# Patient Record
Sex: Female | Born: 1995
Health system: Southern US, Community
[De-identification: ages and names within clinical notes are randomized; demographics above are authoritative.]

## PROBLEM LIST (undated history)

## (undated) ENCOUNTER — Inpatient Hospital Stay: Payer: Self-pay

## (undated) DIAGNOSIS — Z789 Other specified health status: Secondary | ICD-10-CM

## (undated) DIAGNOSIS — N912 Amenorrhea, unspecified: Secondary | ICD-10-CM

## (undated) HISTORY — PX: ANAL FISSURE REPAIR: SHX2312

## (undated) HISTORY — DX: Amenorrhea, unspecified: N91.2

---
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
## 2015-01-20 ENCOUNTER — Encounter: Payer: Self-pay | Admitting: Family Medicine

## 2015-01-20 ENCOUNTER — Ambulatory Visit (INDEPENDENT_AMBULATORY_CARE_PROVIDER_SITE_OTHER): Payer: Federal, State, Local not specified - PPO | Admitting: Family Medicine

## 2015-01-20 VITALS — BP 118/48 | HR 100 | Temp 98.2°F | Resp 16 | Ht 64.0 in | Wt 114.6 lb

## 2015-01-20 DIAGNOSIS — F419 Anxiety disorder, unspecified: Secondary | ICD-10-CM | POA: Diagnosis not present

## 2015-01-20 DIAGNOSIS — R519 Headache, unspecified: Secondary | ICD-10-CM | POA: Insufficient documentation

## 2015-01-20 DIAGNOSIS — M791 Myalgia, unspecified site: Secondary | ICD-10-CM | POA: Insufficient documentation

## 2015-01-20 DIAGNOSIS — Z789 Other specified health status: Secondary | ICD-10-CM | POA: Insufficient documentation

## 2015-01-20 DIAGNOSIS — A749 Chlamydial infection, unspecified: Secondary | ICD-10-CM | POA: Insufficient documentation

## 2015-01-20 DIAGNOSIS — G4700 Insomnia, unspecified: Secondary | ICD-10-CM | POA: Insufficient documentation

## 2015-01-20 DIAGNOSIS — Z7289 Other problems related to lifestyle: Secondary | ICD-10-CM | POA: Insufficient documentation

## 2015-01-20 DIAGNOSIS — Z7251 High risk heterosexual behavior: Secondary | ICD-10-CM | POA: Insufficient documentation

## 2015-01-20 DIAGNOSIS — F39 Unspecified mood [affective] disorder: Secondary | ICD-10-CM | POA: Insufficient documentation

## 2015-01-20 DIAGNOSIS — R51 Headache: Secondary | ICD-10-CM

## 2015-01-20 MED ORDER — PAROXETINE HCL 10 MG PO TABS: 10.0000 mg | ORAL_TABLET | Freq: Every day | ORAL | Status: DC

## 2015-01-20 MED ORDER — TRAZODONE HCL 50 MG PO TABS: 25.0000 mg | ORAL_TABLET | Freq: Every evening | ORAL | Status: DC | PRN

## 2015-01-20 NOTE — Patient Instructions (Signed)
Let me know earlier than two weeks if you can not tolerate the medication.

## 2015-01-20 NOTE — Progress Notes (Signed)
Subjective:     Patient ID: Haley Russell, female   DOB: 07/06/1995, 19 y.o.   MRN: [id number]  HPI  Chief Complaint  Patient presents with  . Anxiety    Patient comes in office today with concerns of anxiety she states that she has been dealing with this issue for the past two years but has gradually worsened since December. Patient recalls that her boyfriend suffered from anger issues since his brothers death last year and it was weighing heavy in there relationship. Patient states that they broke up two months ago, she states that she has tightness in her chest when she is around large crowds, and has developed OCD tendencies.   Denies specific depression or suicidal thoughts though states she does feel down at times. 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 Reports a need to continually clean and keep things in a certain place. Has trouble sleeping-"My insides want to stay moving". She reports anger with most of her sx beginning at the age of 65 when her father died. Reports a psychiatric hospitalization at that time. Currently living with her mother denies substance use.   Review of Systems  Psychiatric/Behavioral:       States she has been in counseling before but it did not help "they can't tell what is going on in my head".       Objective:   Physical Exam  Constitutional: She appears well-developed and well-nourished.  Psychiatric:  Appears irritable: "I feel angry now".  Scores in moderate depression range on PHQ 9 but denies that she is significantly depressed.     Assessment:    1. Acute anxiety - PARoxetine (PAXIL) 10 MG tablet; Take 1 tablet (10 mg total) by mouth daily. Start at 1/2 pill for the first five days to minimize stomach upset.  Dispense: 30 tablet; Refill: 0  2. Insomnia - traZODone (DESYREL) 50 MG tablet; Take 0.5-1 tablets (25-50 mg total) by mouth at bedtime as  needed for sleep.  Dispense: 30 tablet; Refill: 3    Plan:    F/u in two weeks. Will consider psychiatric referral if not improving on current medication.

## 2015-11-13 ENCOUNTER — Encounter: Payer: Self-pay | Admitting: Physician Assistant

## 2015-11-13 ENCOUNTER — Ambulatory Visit (INDEPENDENT_AMBULATORY_CARE_PROVIDER_SITE_OTHER): Payer: Federal, State, Local not specified - PPO | Admitting: Physician Assistant

## 2015-11-13 VITALS — BP 94/52 | HR 120 | Temp 97.5°F | Resp 16 | Wt 129.0 lb

## 2015-11-13 DIAGNOSIS — B379 Candidiasis, unspecified: Secondary | ICD-10-CM | POA: Diagnosis not present

## 2015-11-13 DIAGNOSIS — A499 Bacterial infection, unspecified: Secondary | ICD-10-CM | POA: Diagnosis not present

## 2015-11-13 DIAGNOSIS — B9689 Other specified bacterial agents as the cause of diseases classified elsewhere: Secondary | ICD-10-CM

## 2015-11-13 DIAGNOSIS — Z7251 High risk heterosexual behavior: Secondary | ICD-10-CM | POA: Diagnosis not present

## 2015-11-13 DIAGNOSIS — N898 Other specified noninflammatory disorders of vagina: Secondary | ICD-10-CM

## 2015-11-13 DIAGNOSIS — N760 Acute vaginitis: Secondary | ICD-10-CM | POA: Diagnosis not present

## 2015-11-13 DIAGNOSIS — Z113 Encounter for screening for infections with a predominantly sexual mode of transmission: Secondary | ICD-10-CM | POA: Diagnosis not present

## 2015-11-13 LAB — POCT WET PREP (WET MOUNT)

## 2015-11-13 MED ORDER — FLUCONAZOLE 150 MG PO TABS: 150.0000 mg | ORAL_TABLET | Freq: Once | ORAL | Status: DC

## 2015-11-13 MED ORDER — METRONIDAZOLE 500 MG PO TABS: 500.0000 mg | ORAL_TABLET | Freq: Two times a day (BID) | ORAL | Status: DC

## 2015-11-13 NOTE — Progress Notes (Signed)
Patient ID: Haley Russell, female   DOB: 1996-05-09, 20 y.o.   MRN: [id number]       Patient: Haley Russell Female    DOB: 1996-05-09   20 y.o.   MRN: [id number] Visit Date: 11/13/2015  Today's Provider: Margaretann LovelessJennifer M Shawndra Clute, PA-C   Chief Complaint  Patient presents with  . Vaginal Discharge   Subjective:    HPI  Patient is here to discuss vaginal discharge. She states this has been an issue for about 2 months now but she does not like doctors and waited to come in. Bad odor is present. Discharge is white. She is sexually active and usually uses condoms, but has not done that 100% with her current boyfriend. She states the discharge issue started when she was using condoms all the time. Sometimes after sex vaginal area feels swollen but not every time. Patient denies having any urinary issues. She denies any history of STDs or STD exposure to her knowledge.    No Known Allergies Previous Medications   PAROXETINE (PAXIL) 10 MG TABLET    Take 1 tablet (10 mg total) by mouth daily. Start at 1/2 pill for the first five days to minimize stomach upset.   TRAZODONE (DESYREL) 50 MG TABLET    Take 0.5-1 tablets (25-50 mg total) by mouth at bedtime as needed for sleep.    Review of Systems  Constitutional: Negative.   Respiratory: Negative.   Cardiovascular: Negative.   Genitourinary: Positive for vaginal discharge. Negative for dysuria, urgency, frequency, flank pain, difficulty urinating and pelvic pain.    Social History  Substance Use Topics  . Smoking status: Never Smoker   . Smokeless tobacco: Not on file  . Alcohol Use: Not on file   Objective:   BP 94/52 mmHg  Pulse 120  Temp(Src) 97.5 F (36.4 C)  Resp 16  Wt 129 lb (58.514 kg)  LMP 10/13/2015  Physical Exam  Constitutional: She appears well-developed and well-nourished. No distress.  Cardiovascular: Normal rate, regular rhythm and normal heart sounds.  Exam reveals no gallop and no friction rub.   No murmur  heard. Pulmonary/Chest: Effort normal and breath sounds normal. No respiratory distress. She has no wheezes. She has no rales.  Abdominal: Soft. Bowel sounds are normal. She exhibits no distension. There is no tenderness.  Genitourinary: No erythema or tenderness in the vagina. No signs of injury around the vagina. Vaginal discharge (white, milky, foul smelling) found.  Skin: She is not diaphoretic.  Vitals reviewed.       Assessment & Plan:     1. Vaginal discharge Wet prep was positive for yeast and clue cells. Will also send a swab to check for gonorrhea, Chlamydia and trichomoniasis.Will treat bacterial vaginosis as below with metronidazole and will treat yeast as below with Diflucan. I will follow-up with her pending all of the results. - POCT Wet Prep ([place]) - NuSwab Vaginitis Plus (VG+)  2. Unprotected sex See above medical treatment plan. - NuSwab Vaginitis Plus (VG+) - HIV antibody (with reflex) - RPR  3. Screen for STD (sexually transmitted disease) See above medical treatment plan. - NuSwab Vaginitis Plus (VG+) - HIV antibody (with reflex) - RPR  4. Bacterial vaginosis See above medical treatment plan. - metroNIDAZOLE (FLAGYL) 500 MG tablet; Take 1 tablet (500 mg total) by mouth 2 (two) times daily.  Dispense: 14 tablet; Refill: 0  5. Yeast infection See above medical treatment plan. - fluconazole (DIFLUCAN) 150 MG tablet; Take 1 tablet (150 mg total)  by mouth once.  Dispense: 1 tablet; Refill: 0       Margaretann Loveless, PA-C  [facility]

## 2015-11-13 NOTE — Patient Instructions (Signed)

## 2015-11-16 ENCOUNTER — Telehealth: Payer: Self-pay

## 2015-11-16 LAB — NUSWAB VAGINITIS PLUS (VG+)
ATOPOBIUM VAGINAE: HIGH {score} — AB
BVAB 2: HIGH Score — AB
CANDIDA ALBICANS, NAA: NEGATIVE
Candida glabrata, NAA: NEGATIVE
Chlamydia trachomatis, NAA: NEGATIVE
MEGASPHAERA 1: HIGH {score} — AB
NEISSERIA GONORRHOEAE, NAA: NEGATIVE
Trich vag by NAA: NEGATIVE

## 2015-11-16 NOTE — Telephone Encounter (Signed)
No voicemail set up  Thanks,  -Franziska Podgurski

## 2015-11-16 NOTE — Telephone Encounter (Signed)
-----   Message from Margaretann LovelessJennifer M Burnette, [place]-C sent at 11/16/2015  1:47 PM EDT ----- NuSwab was negative for GC/chlamydia and trichomonas but positive for BV which you are already being treated for. Continue treatment until completed.

## 2015-11-17 NOTE — Telephone Encounter (Signed)
Spoke with Kriste BasqueBecky patient's mother and left message for patient to return call back. Per mother patient doesn't have any minutes on her phone and will probably see her until the weekend.

## 2015-11-24 NOTE — Telephone Encounter (Signed)
Pt informed and voiced understanding of results. 

## 2015-11-24 NOTE — Telephone Encounter (Signed)
Left message with boyfriend. Patient provided this number to contact her and he will give her the message. Number is [phone]  Thanks,  -Westley Blass

## 2016-03-25 ENCOUNTER — Ambulatory Visit (INDEPENDENT_AMBULATORY_CARE_PROVIDER_SITE_OTHER): Payer: Federal, State, Local not specified - PPO | Admitting: Physician Assistant

## 2016-03-25 ENCOUNTER — Encounter: Payer: Self-pay | Admitting: Physician Assistant

## 2016-03-25 ENCOUNTER — Other Ambulatory Visit: Payer: Self-pay

## 2016-03-25 VITALS — BP 102/64 | HR 68 | Temp 98.0°F | Resp 16 | Wt 131.0 lb

## 2016-03-25 DIAGNOSIS — N390 Urinary tract infection, site not specified: Secondary | ICD-10-CM | POA: Diagnosis not present

## 2016-03-25 DIAGNOSIS — R319 Hematuria, unspecified: Secondary | ICD-10-CM | POA: Diagnosis not present

## 2016-03-25 LAB — POCT URINALYSIS DIPSTICK
Bilirubin, UA: NEGATIVE
Glucose, UA: NEGATIVE
Ketones, UA: NEGATIVE
Nitrite, UA: NEGATIVE
Protein, UA: NEGATIVE
Spec Grav, UA: 1.015
Urobilinogen, UA: 0.2
pH, UA: 7

## 2016-03-25 MED ORDER — CIPROFLOXACIN HCL 500 MG PO TABS: 500.0000 mg | ORAL_TABLET | Freq: Two times a day (BID) | ORAL | 0 refills | Status: AC

## 2016-03-25 MED ORDER — SULFAMETHOXAZOLE-TRIMETHOPRIM 800-160 MG PO TABS: 1.0000 | ORAL_TABLET | Freq: Two times a day (BID) | ORAL | 0 refills | Status: DC

## 2016-03-25 NOTE — Patient Instructions (Signed)

## 2016-03-25 NOTE — Progress Notes (Signed)
[name]  Chief Complaint  Patient presents with  . Urinary Tract Infection    Started about six days ago.     Subjective:    Patient ID: Haley Russell, female    DOB: December 27, 1995, 20 y.o.   MRN: [id number]   Urinary Tract Infection: Patient complains of dysuria and frequency She has had symptoms for 6 days. Patient also complains of back pain and stomach ache. Patient denies vaginal discharge. Patient does have a history of recurrent UTI.  Patient does not have a history of pyelonephritis or other renal issues. Patient reports vaginal discharge pt says she is about to start her period and says it is her usual discharge, and denies new sexual partners. The patient denies recent travel outside of the [place] LMP 03/05/2016.   Review of Systems  Constitutional: Positive for chills, diaphoresis and malaise/fatigue. Negative for fever and weight loss.  Gastrointestinal: Positive for abdominal pain (Slight left sided. ). Negative for blood in stool, constipation, diarrhea, heartburn, nausea and vomiting.  Genitourinary: Positive for dysuria, frequency and urgency (off and on). Negative for hematuria.  Musculoskeletal: Positive for back pain (Left sided back pain).  Neurological: Negative for weakness.       Objective:   BP 102/64 (BP Location: Left Arm, Patient Position: Sitting, Cuff Size: Normal)   Pulse 68   Temp 98 F (36.7 C) (Oral)   Resp 16   Wt 131 lb (59.4 kg)   LMP 03/02/2016   BMI 22.49 kg/m   Patient Active Problem List   Diagnosis Date Noted  . Cephalalgia 01/20/2015  . Insomnia 01/20/2015  . Episodic mood disorder (HCC) 01/20/2015  . Muscle ache 01/20/2015  . Problems related to high-risk sexual behavior 01/20/2015  . Alcohol drinker 01/20/2015    Outpatient Encounter Prescriptions as of 03/25/2016  Medication Sig  . ciprofloxacin (CIPRO) 500 MG tablet Take 1 tablet (500 mg total) by mouth 2 (two) times  daily.  . fluconazole (DIFLUCAN) 150 MG tablet Take 1 tablet (150 mg total) by mouth once.  . metroNIDAZOLE (FLAGYL) 500 MG tablet Take 1 tablet (500 mg total) by mouth 2 (two) times daily.  Marland Kitchen PARoxetine (PAXIL) 10 MG tablet Take 1 tablet (10 mg total) by mouth daily. Start at 1/2 pill for the first five days to minimize stomach upset. (Patient not taking: Reported on 11/13/2015)  . traZODone (DESYREL) 50 MG tablet Take 0.5-1 tablets (25-50 mg total) by mouth at bedtime as needed for sleep. (Patient not taking: Reported on 11/13/2015)  . [DISCONTINUED] sulfamethoxazole-trimethoprim (BACTRIM DS,SEPTRA DS) 800-160 MG tablet Take 1 tablet by mouth 2 (two) times daily.   No facility-administered encounter medications on file as of 03/25/2016.     No Known Allergies     Physical Exam  Constitutional: She is oriented to person, place, and time. She appears well-nourished.  Non-toxic appearance. She does not have a sickly appearance. She does not appear ill. No distress.  Abdominal: Soft. Bowel sounds are normal. She exhibits no distension. There is tenderness in the suprapubic area. There is CVA tenderness. There is no rigidity and no guarding.  Neurological: She is alert and oriented to person, place, and time.  Skin: Skin is warm and dry. She is not diaphoretic.  Psychiatric: She has a normal mood and affect. Her behavior is normal.       Assessment & Plan:   Problem List Items Addressed This Visit    None    Visit Diagnoses  Urinary tract infection with hematuria, site unspecified    -  Primary   Relevant Medications   ciprofloxacin (CIPRO) 500 MG tablet      1. Ordered in office urinalysis with culture.   Urinalysis    Component Value Date/Time   COLORURINE Yellow 09/13/2012 1828   APPEARANCEUR Cloudy 09/13/2012 1828   LABSPEC 1.025 09/13/2012 1828   PHURINE 7.0 09/13/2012 1828   GLUCOSEU Negative 09/13/2012 1828   HGBUR Negative 09/13/2012 1828   BILIRUBINUR Negative  09/13/2012 1828   KETONESUR Negative 09/13/2012 1828   PROTEINUR 30 mg/dL 16/10/960404/08/2012 [id number]   NITRITE Negative 09/13/2012 1828   LEUKOCYTESUR 3+ 09/13/2012 1828    2. Patient not toxic looking, but does have left sided CVA tenderness. Will treat like pyelonephritis. Instructed the patient to complete the following course of antibiotics:  Cipro 500 mg BID x 7 days.   3. Patient should report back to clinic or to emergency room should they experience extreme low back pain, fever, nausea, vomiting.     Patient Instructions  Urinary Tract Infection Urinary tract infections (UTIs) can develop anywhere along your urinary tract. Your urinary tract is your body's drainage system for removing wastes and extra water. Your urinary tract includes two kidneys, two ureters, a bladder, and a urethra. Your kidneys are a pair of bean-shaped organs. Each kidney is about the size of your fist. They are located below your ribs, one on each side of your spine. CAUSES Infections are caused by microbes, which are microscopic organisms, including fungi, viruses, and bacteria. These organisms are so small that they can only be seen through a microscope. Bacteria are the microbes that most commonly cause UTIs. SYMPTOMS  Symptoms of UTIs may vary by age and gender of the patient and by the location of the infection. Symptoms in young women typically include a frequent and intense urge to urinate and a painful, burning feeling in the bladder or urethra during urination. Older women and men are more likely to be tired, shaky, and weak and have muscle aches and abdominal pain. A fever may mean the infection is in your kidneys. Other symptoms of a kidney infection include pain in your back or sides below the ribs, nausea, and vomiting. DIAGNOSIS To diagnose a UTI, your caregiver will ask you about your symptoms. Your caregiver will also ask you to provide a urine sample. The urine sample will be tested for bacteria and white  blood cells. White blood cells are made by your body to help fight infection. TREATMENT  Typically, UTIs can be treated with medication. Because most UTIs are caused by a bacterial infection, they usually can be treated with the use of antibiotics. The choice of antibiotic and length of treatment depend on your symptoms and the type of bacteria causing your infection. HOME CARE INSTRUCTIONS  If you were prescribed antibiotics, take them exactly as your caregiver instructs you. Finish the medication even if you feel better after you have only taken some of the medication.  Drink enough water and fluids to keep your urine clear or pale yellow.  Avoid caffeine, tea, and carbonated beverages. They tend to irritate your bladder.  Empty your bladder often. Avoid holding urine for long periods of time.  Empty your bladder before and after sexual intercourse.  After a bowel movement, women should cleanse from front to back. Use each tissue only once. SEEK MEDICAL CARE IF:   You have back pain.  You develop a fever.  Your symptoms do  not begin to resolve within 3 days. SEEK IMMEDIATE MEDICAL CARE IF:   You have severe back pain or lower abdominal pain.  You develop chills.  You have nausea or vomiting.  You have continued burning or discomfort with urination. MAKE SURE YOU:   Understand these instructions.  Will watch your condition.  Will get help right away if you are not doing well or get worse.   This information is not intended to replace advice given to you by your health care provider. Make sure you discuss any questions you have with your health care provider.   Document Released: 03/09/2005 Document Revised: 02/18/2015 Document Reviewed: 07/08/2011 Elsevier Interactive Patient Education [employer]

## 2016-03-25 NOTE — Addendum Note (Signed)
Addended by: Kavin LeechWALSH, Shimshon Narula E on: 03/25/2016 11:34 AM   Modules accepted: Orders

## 2016-03-28 ENCOUNTER — Telehealth: Payer: Self-pay

## 2016-03-28 LAB — URINE CULTURE

## 2016-03-28 NOTE — Telephone Encounter (Signed)
-----   Message from April Paul HalfM Miller, [place] sent at 03/28/2016  4:09 PM EDT -----   ----- Message ----- From: Trey SailorsAdriana M Pollak, PA-C Sent: 03/28/2016   8:05 AM To: [facility]  Uti is sensitive to Cipro, please finish course of antibiotics. Please advise patient thank you

## 2016-03-28 NOTE — Telephone Encounter (Signed)
LMTCB 03/28/2016  Thanks,   -Vernona RiegerLaura

## 2016-03-29 NOTE — Telephone Encounter (Signed)
Unable to reach pt. Will try again later.  

## 2016-03-29 NOTE — Telephone Encounter (Signed)
Pt advised.   Thanks,   -Laura  

## 2016-04-29 ENCOUNTER — Encounter: Payer: Self-pay | Admitting: Physician Assistant

## 2016-04-29 ENCOUNTER — Ambulatory Visit (INDEPENDENT_AMBULATORY_CARE_PROVIDER_SITE_OTHER): Payer: Federal, State, Local not specified - PPO | Admitting: Physician Assistant

## 2016-04-29 VITALS — BP 102/62 | HR 68 | Temp 98.4°F | Resp 16 | Wt 130.0 lb

## 2016-04-29 DIAGNOSIS — N898 Other specified noninflammatory disorders of vagina: Secondary | ICD-10-CM

## 2016-04-29 MED ORDER — METRONIDAZOLE 500 MG PO TABS: 500.0000 mg | ORAL_TABLET | Freq: Two times a day (BID) | ORAL | 0 refills | Status: AC

## 2016-04-29 MED ORDER — METRONIDAZOLE ER 750 MG PO TB24: 750.0000 mg | ORAL_TABLET | Freq: Every day | ORAL | 0 refills | Status: DC

## 2016-04-29 NOTE — Addendum Note (Signed)
Addended by: Trey SailorsPOLLAK, Leonila M on: 04/29/2016 01:22 PM   Modules accepted: Orders

## 2016-04-29 NOTE — Patient Instructions (Signed)

## 2016-04-29 NOTE — Progress Notes (Signed)
Patient: Haley Russell Female    DOB: [birth year]   20 y.o.   MRN: [id number] Visit Date: 04/29/2016  Today's Provider: Trey SailorsAdriana M Pollak, PA-C   Chief Complaint  Patient presents with  . Vaginitis    Started back a few days ago.    Subjective:    Vaginal Discharge  The patient's primary symptoms include a genital odor and vaginal discharge. The patient's pertinent negatives include no genital itching or pelvic pain. This is a recurrent problem. The current episode started in the past 7 days. The problem occurs constantly. The problem has been gradually worsening. The patient is experiencing no pain. Pertinent negatives include no abdominal pain, constipation, diarrhea, flank pain, frequency, hematuria, joint swelling, nausea, painful intercourse or urgency. The vaginal discharge was white. There has been no bleeding.   Patient has a history of bacterial vaginosis   No Known Allergies   Current Outpatient Prescriptions:  .  PARoxetine (PAXIL) 10 MG tablet, Take 1 tablet (10 mg total) by mouth daily. Start at 1/2 pill for the first five days to minimize stomach upset. (Patient not taking: Reported on 04/29/2016), Disp: 30 tablet, Rfl: 0 .  traZODone (DESYREL) 50 MG tablet, Take 0.5-1 tablets (25-50 mg total) by mouth at bedtime as needed for sleep. (Patient not taking: Reported on 04/29/2016), Disp: 30 tablet, Rfl: 3  Review of Systems  Constitutional: Negative.   Gastrointestinal: Negative for abdominal pain, constipation, diarrhea and nausea.  Genitourinary: Positive for vaginal discharge. Negative for decreased urine volume, difficulty urinating, flank pain, frequency, genital sores, hematuria, menstrual problem, pelvic pain, urgency, vaginal bleeding and vaginal pain.    Social History  Substance Use Topics  . Smoking status: Current Every Day Smoker    Types: Cigarettes  . Smokeless tobacco: Never Used  . Alcohol use Not on file   Objective:   BP 102/62 (BP  Location: Left Arm, Patient Position: Sitting, Cuff Size: Normal)   Pulse 68   Temp 98.4 F (36.9 C) (Oral)   Resp 16   Wt 130 lb (59 kg)   LMP 04/01/2016   BMI 22.31 kg/m   Physical Exam  Constitutional: She is oriented to person, place, and time. She appears well-developed and well-nourished.  Abdominal: Soft. She exhibits no distension.  Genitourinary: There is no rash, tenderness or lesion on the right labia. There is no rash, tenderness or lesion on the left labia. No erythema, tenderness or bleeding in the vagina. Vaginal discharge found.  Genitourinary Comments: Homogenous white discharge coating vaginal canal.   Neurological: She is alert and oriented to person, place, and time.        Assessment & Plan:      Problem List Items Addressed This Visit    None    Visit Diagnoses    Vaginal discharge    -  Primary   Relevant Medications   metronidazole (FLAGYL ER) 750 MG 24 hr tablet   Other Relevant Orders   NuSwab BV and Candida, NAA     Patient is 20 y/o presenting with vaginal discharge concerning for Bv. Patient not concerned for STDs. Patient reports this is similar to BV she had before. Will start metronidazole and contact pt about results of NuSwab. Counseled about not using alcohol with this drug.   Return if symptoms worsen or fail to improve.   Patient Instructions  Bacterial Vaginosis Bacterial vaginosis is an infection of the vagina. It happens when too many germs (bacteria) grow in the  vagina. This infection puts you at risk for infections from sex (STIs). Treating this infection can lower your risk for some STIs. You should also treat this if you are pregnant. It can cause your baby to be born early. Follow these instructions at home: Medicines  Take over-the-counter and prescription medicines only as told by your doctor.  Take or use your antibiotic medicine as told by your doctor. Do not stop taking or using it even if you start to feel  better. General instructions  If you your sexual partner is a woman, tell her that you have this infection. She needs to get treatment if she has symptoms. If you have a female partner, he does not need to be treated.  During treatment:  Avoid sex.  Do not douche.  Avoid alcohol as told.  Avoid breastfeeding as told.  Drink enough fluid to keep your pee (urine) clear or pale yellow.  Keep your vagina and butt (rectum) clean.  Wash the area with warm water every day.  Wipe from front to back after you use the toilet.  Keep all follow-up visits as told by your doctor. This is important. Preventing this condition  Do not douche.  Use only warm water to wash around your vagina.  Use protection when you have sex. This includes:  Latex condoms.  Dental dams.  Limit how many people you have sex with. It is best to only have sex with the same person (be monogamous).  Get tested for STIs. Have your partner get tested.  Wear underwear that is cotton or lined with cotton.  Avoid tight pants and pantyhose. This is most important in summer.  Do not use any products that have nicotine or tobacco in them. These include cigarettes and e-cigarettes. If you need help quitting, ask your doctor.  Do not use illegal drugs.  Limit how much alcohol you drink. Contact a doctor if:  Your symptoms do not get better, even after you are treated.  You have more discharge or pain when you pee (urinate).  You have a fever.  You have pain in your belly (abdomen).  You have pain with sex.  Your bleed from your vagina between periods. Summary  This infection happens when too many germs (bacteria) grow in the vagina.  Treating this condition can lower your risk for some infections from sex (STIs).  You should also treat this if you are pregnant. It can cause early (premature) birth.  Do not stop taking or using your antibiotic medicine even if you start to feel better. This  information is not intended to replace advice given to you by your health care provider. Make sure you discuss any questions you have with your health care provider. Document Released: 03/08/2008 Document Revised: 02/13/2016 Document Reviewed: 02/13/2016 Elsevier Interactive Patient Education  2017 [employer]   The entirety of the information documented in the History of Present Illness, Review of Systems and Physical Exam were personally obtained by me. Portions of this information were initially documented by Kavin LeechLaura Walsh, CMA and reviewed by me for thoroughness and accuracy.         Trey SailorsAdriana M Pollak, PA-C  [facility]

## 2016-05-03 ENCOUNTER — Telehealth: Payer: Self-pay

## 2016-05-03 LAB — NUSWAB BV AND CANDIDA, NAA
Atopobium vaginae: HIGH Score — AB
BVAB 2: HIGH Score — AB
Candida albicans, NAA: NEGATIVE
Candida glabrata, NAA: NEGATIVE

## 2016-05-03 NOTE — Telephone Encounter (Signed)
LMTCB-KW 

## 2016-05-03 NOTE — Telephone Encounter (Signed)
-----   Message from Trey SailorsAdriana M Pollak, [place]-C sent at 05/03/2016 10:02 AM EST ----- Nu Swab resulted positive for Bv. Patient should finish her flagyl.

## 2016-05-04 NOTE — Telephone Encounter (Signed)
Patient has been advised. KW 

## 2016-05-04 NOTE — Telephone Encounter (Signed)
Tried to contact patient, patients voicemail box has not been set up yet. Will try contacting patient again at a later time. KW

## 2016-05-30 ENCOUNTER — Ambulatory Visit (INDEPENDENT_AMBULATORY_CARE_PROVIDER_SITE_OTHER): Payer: Federal, State, Local not specified - PPO | Admitting: Physician Assistant

## 2016-05-30 ENCOUNTER — Encounter: Payer: Self-pay | Admitting: Physician Assistant

## 2016-05-30 VITALS — BP 102/64 | HR 96 | Temp 98.3°F | Resp 16 | Wt 136.0 lb

## 2016-05-30 DIAGNOSIS — N760 Acute vaginitis: Secondary | ICD-10-CM

## 2016-05-30 DIAGNOSIS — J039 Acute tonsillitis, unspecified: Secondary | ICD-10-CM | POA: Diagnosis not present

## 2016-05-30 DIAGNOSIS — B9689 Other specified bacterial agents as the cause of diseases classified elsewhere: Secondary | ICD-10-CM

## 2016-05-30 DIAGNOSIS — J069 Acute upper respiratory infection, unspecified: Secondary | ICD-10-CM | POA: Diagnosis not present

## 2016-05-30 LAB — POCT INFLUENZA A/B
Influenza A, POC: NEGATIVE
Influenza B, POC: NEGATIVE

## 2016-05-30 LAB — POCT RAPID STREP A (OFFICE): Rapid Strep A Screen: NEGATIVE

## 2016-05-30 MED ORDER — CLINDAMYCIN HCL 300 MG PO CAPS: 300.0000 mg | ORAL_CAPSULE | Freq: Two times a day (BID) | ORAL | 0 refills | Status: AC

## 2016-05-30 MED ORDER — PREDNISONE 20 MG PO TABS: 20.0000 mg | ORAL_TABLET | Freq: Every day | ORAL | 0 refills | Status: AC

## 2016-05-30 NOTE — Progress Notes (Addendum)
Patient: Haley Russell Female    DOB: 1995-11-30   20 y.o.   MRN: [id number] Visit Date: 05/30/2016  Today's Provider: Trey SailorsAdriana M Pollak, PA-C   Chief Complaint  Patient presents with  . Sore Throat    Started Yesterday  . URI   Subjective:    Sore Throat   This is a new problem. The current episode started yesterday. The problem has been unchanged. Neither side of throat is experiencing more pain than the other. The pain is at a severity of 5/10. Associated symptoms include congestion, coughing, headaches, a plugged ear sensation, shortness of breath and trouble swallowing. Pertinent negatives include no ear discharge, ear pain, neck pain or stridor. She has tried nothing for the symptoms. The treatment provided no relief.  URI   This is a new problem. The current episode started 1 to 4 weeks ago. The problem has been gradually worsening. Associated symptoms include congestion, coughing, headaches, a plugged ear sensation, rhinorrhea, sneezing, a sore throat and wheezing. Pertinent negatives include no dysuria, ear pain, nausea, neck pain or sinus pain. She has tried nothing for the symptoms. The treatment provided no relief.  Vaginal Discharge  The patient's primary symptoms include a genital odor and vaginal discharge. The patient's pertinent negatives include no pelvic pain or vaginal bleeding. This is a recurrent problem. The current episode started in the past 7 days. The problem has been unchanged. She is not pregnant. Associated symptoms include chills, headaches and a sore throat. Pertinent negatives include no back pain, dysuria, fever, flank pain, frequency or nausea. The vaginal discharge was white and malodorous. No, her partner does not have an STD.   No Known Allergies   Current Outpatient Prescriptions:  .  PARoxetine (PAXIL) 10 MG tablet, Take 1 tablet (10 mg total) by mouth daily. Start at 1/2 pill for the first five days to minimize stomach upset. (Patient  not taking: Reported on 04/29/2016), Disp: 30 tablet, Rfl: 0 .  traZODone (DESYREL) 50 MG tablet, Take 0.5-1 tablets (25-50 mg total) by mouth at bedtime as needed for sleep. (Patient not taking: Reported on 04/29/2016), Disp: 30 tablet, Rfl: 3  Review of Systems  Constitutional: Positive for chills, diaphoresis and fatigue. Negative for activity change, appetite change, fever and unexpected weight change.  HENT: Positive for congestion, hearing loss, postnasal drip, rhinorrhea, sinus pressure, sneezing, sore throat and trouble swallowing. Negative for ear discharge, ear pain, sinus pain, tinnitus and voice change.   Eyes: Negative.   Respiratory: Positive for cough, shortness of breath and wheezing. Negative for apnea, choking, chest tightness and stridor.   Gastrointestinal: Negative.  Negative for nausea.  Genitourinary: Positive for vaginal discharge. Negative for dysuria, flank pain, frequency and pelvic pain.  Musculoskeletal: Positive for myalgias. Negative for arthralgias, back pain, gait problem, joint swelling, neck pain and neck stiffness.  Neurological: Positive for headaches. Negative for dizziness and light-headedness.    Social History  Substance Use Topics  . Smoking status: Current Every Day Smoker    Types: Cigarettes  . Smokeless tobacco: Never Used  . Alcohol use Not on file   Objective:   There were no vitals taken for this visit.  Physical Exam  Constitutional: She appears well-developed and well-nourished.  HENT:  Mouth/Throat: Uvula is midline. Posterior oropharyngeal edema and posterior oropharyngeal erythema present. No oropharyngeal exudate or tonsillar abscesses.  Tms dull bilaterally. Tonsils are grossly swollen bilaterally without petechiae or exudate.  Eyes: Conjunctivae are normal.  Cardiovascular:  Normal rate and regular rhythm.   Pulmonary/Chest: Effort normal. No respiratory distress. She has wheezes in the right upper field. She has no rales.    Abdominal: Soft. Bowel sounds are normal. She exhibits no distension. There is no tenderness. There is no CVA tenderness.  Lymphadenopathy:    She has cervical adenopathy.        Assessment & Plan:      Problem List Items Addressed This Visit    None    Visit Diagnoses    Tonsillitis    -  Primary   Relevant Medications   predniSONE (DELTASONE) 20 MG tablet   Other Relevant Orders   Epstein-Barr virus VCA antibody panel   Upper respiratory tract infection, unspecified type       Relevant Medications   clindamycin (CLEOCIN) 300 MG capsule   Other Relevant Orders   Epstein-Barr virus VCA antibody panel   Bacterial vaginosis       Relevant Medications   clindamycin (CLEOCIN) 300 MG capsule     Patient is 20 y/o presenting with above issues.  URI  Rapid flu and strep swab negative in office today. Patient declines Xray today. Counseled patient on various etiologies, most likely viral. Offered patient an EBV panel as she fits the symptom profile and demographic. Patient may not be able to get this test 2/2 financial reasons, but she will try if it might help her with missing work. Counseled patient on infection control precautions and symptomatic treatment for mono. Will give steroids for tonsillitis, counseled on return precautions.  Bacterial Vaginosis  Patient having recurrence of symptoms. Was treated last time with flagyl Po. Patient did consume alcohol while on this, but had no adverse effects. Pt declines any sort of vaginal suppository. Will switch to clindamycin Po. Have counseled patient on adverse effects, such as C. Diff, patient accepts risks.  Return if symptoms worsen or fail to improve.  There are no Patient Instructions on file for this visit.  The entirety of the information documented in the History of Present Illness, Review of Systems and Physical Exam were personally obtained by me. Portions of this information were initially documented by Kavin LeechLaura Lynett Brasil, CMA  and reviewed by me for thoroughness and accuracy.           Trey SailorsAdriana M Pollak, PA-C  [facility]

## 2016-05-30 NOTE — Patient Instructions (Signed)
Tonsillitis Tonsillitis is an infection of the throat. This infection causes the tonsils to become red, tender, and puffy (swollen). Tonsils are groups of tissue at the back of your throat. If bacteria caused your infection, antibiotic medicine will be given to you. Sometimes symptoms of tonsillitis can be relieved with the use of steroid medicine. If your tonsillitis is severe and happens often, you may need to get your tonsils removed (tonsillectomy). Follow these instructions at home:  Rest and sleep often.  Drink enough fluids to keep your pee (urine) clear or pale yellow.  While your throat is sore, eat soft or liquid foods like:  Soup.  Ice cream.  Instant breakfast drinks.  Eat frozen ice pops.  Gargle with a warm or cold liquid to help soothe the throat. Gargle with a water and salt mix. Mix 1/4 teaspoon of salt and 1/4 teaspoon of baking soda in 1 cup of water.  Only take medicines as told by your doctor.  If you are given medicines (antibiotics), take them as told. Finish them even if you start to feel better. Contact a doctor if:  You have large, tender lumps in your neck.  You have a rash.  You cough up green, yellow-brown, or bloody fluid.  You cannot swallow liquids or food for 24 hours.  You notice that only one of your tonsils is swollen. Get help right away if:  You throw up (vomit).  You have a very bad headache.  You have a stiff neck.  You have chest pain.  You have trouble breathing or swallowing.  You have bad throat pain, drooling, or your voice changes.  You have bad pain not helped by medicine.  You cannot fully open your mouth.  You have redness, puffiness, or bad pain in the neck.  You have a fever. This information is not intended to replace advice given to you by your health care provider. Make sure you discuss any questions you have with your health care provider. Document Released: 11/16/2007 Document Revised: 11/05/2015 Document  Reviewed: 11/16/2012 Elsevier Interactive Patient Education  2017 [employer]

## 2016-07-27 ENCOUNTER — Telehealth: Payer: Self-pay | Admitting: Family Medicine

## 2016-07-27 NOTE — Telephone Encounter (Signed)
Pt called wanting Charnell to call her back about her vaginal yeast.infection.  She would like something called in.  She uses [facility] [address]  Her call back is [phone]  Thanks Barth Kirkseri

## 2016-07-27 NOTE — Telephone Encounter (Signed)
Patient reports that she has had symptoms for about 2 days. She reports that she has a white discharge that looks like cottage cheese. She has very mild itching, and she has not been using anything OTC for her symptoms. Patient reports that the OTC creams seem to make her symptoms worse.  She also mentions that she has not been sexually active within the last 2 weeks. She denies any vaginal odors or vaginal bleeding.

## 2016-07-28 NOTE — Telephone Encounter (Signed)
I have treated patient before for BV but for not yeast. Would prefer patient to come in so I can assess. Thanks.

## 2016-07-28 NOTE — Telephone Encounter (Signed)
Pt called back.  Please call [phone]  Thanks Barth Kirksteri

## 2016-07-28 NOTE — Telephone Encounter (Signed)
Patient was advised and declined coming in for an appt. KW

## 2017-03-31 ENCOUNTER — Encounter: Payer: Self-pay | Admitting: Physician Assistant

## 2017-03-31 ENCOUNTER — Ambulatory Visit (INDEPENDENT_AMBULATORY_CARE_PROVIDER_SITE_OTHER): Payer: Federal, State, Local not specified - PPO | Admitting: Physician Assistant

## 2017-03-31 VITALS — BP 108/72 | HR 75 | Temp 97.9°F | Resp 14 | Wt 148.2 lb

## 2017-03-31 DIAGNOSIS — J069 Acute upper respiratory infection, unspecified: Secondary | ICD-10-CM | POA: Diagnosis not present

## 2017-03-31 DIAGNOSIS — B9789 Other viral agents as the cause of diseases classified elsewhere: Secondary | ICD-10-CM | POA: Diagnosis not present

## 2017-03-31 NOTE — Progress Notes (Signed)
       Patient: Haley Russell Female    DOB: 1996-02-23   21 y.o.   MRN: [id number] Visit Date: 03/31/2017  Today's Provider: Trey SailorsAdriana M Leslea Vowles, PA-C   Chief Complaint  Patient presents with  . Nasal Congestion   Subjective:    HPI  Patient is here due not feeling well. Symptoms started yesterday 03/30/17. Symptoms are head congestion, sinus pressure and pain, headache, nasal congestion, runny nose, cough-productive with brownish mucus, sneezing, fatigue, ears popping, felt like she had a slight fever yesterday morning. She has taking cold medicine over the counter. She did get her flu shot for the season.    No Known Allergies   Current Outpatient Prescriptions:  .  AFLURIA QUADRIVALENT 0.5 ML injection, inject 0.5 milliliter intramuscularly, Disp: , Rfl: 0  Review of Systems  Constitutional: Positive for fatigue.  HENT: Positive for congestion, postnasal drip, rhinorrhea, sinus pain, sinus pressure, sneezing and voice change.   Respiratory: Positive for cough. Negative for apnea, chest tightness and wheezing.   Cardiovascular: Negative.   Gastrointestinal: Positive for vomiting (this morning from phlegm/drainage per patient).  Neurological: Positive for headaches. Negative for dizziness and light-headedness.    Social History  Substance Use Topics  . Smoking status: Current Every Day Smoker    Packs/day: 1.50    Types: Cigarettes  . Smokeless tobacco: Never Used  . Alcohol use Not on file   Objective:   BP 108/72   Pulse 75   Temp 97.9 F (36.6 C)   Resp 14   Wt 148 lb 3.2 oz (67.2 kg)   SpO2 98%   BMI 25.44 kg/m  Vitals:   03/31/17 1438  BP: 108/72  Pulse: 75  Resp: 14  Temp: 97.9 F (36.6 C)  SpO2: 98%  Weight: 148 lb 3.2 oz (67.2 kg)     Physical Exam  Constitutional: She appears well-developed and well-nourished.  HENT:  Right Ear: External ear normal.  Left Ear: External ear normal.  Mouth/Throat: Oropharynx is clear and moist. No  oropharyngeal exudate.  Eyes: Right eye exhibits discharge. Left eye exhibits discharge.  Neck: Neck supple.  Cardiovascular: Normal rate and regular rhythm.   Pulmonary/Chest: Effort normal. No respiratory distress. She has wheezes. She has no rales.  Slight expiratory wheeze RUF.  Lymphadenopathy:    She has no cervical adenopathy.  Skin: Skin is warm and dry.  Psychiatric: She has a normal mood and affect. Her behavior is normal.        Assessment & Plan:     1. Viral URI with cough  7-10 course. May call in 10 days if develop resulting sinus infection. OTC medications like Sudafed and flonase for congestion. Work note provided.  Return if symptoms worsen or fail to improve.  The entirety of the information documented in the History of Present Illness, Review of Systems and Physical Exam were personally obtained by me. Portions of this information were initially documented by [facility] Hopkins, CMA and reviewed by me for thoroughness and accuracy.             Trey SailorsAdriana M Cora Brierley, PA-C  [facility]

## 2017-03-31 NOTE — Patient Instructions (Signed)
Upper Respiratory Infection, Adult Most upper respiratory infections (URIs) are caused by a virus. A URI affects the nose, throat, and upper air passages. The most common type of URI is often called "the common cold." Follow these instructions at home:  Take medicines only as told by your doctor.  Gargle warm saltwater or take cough drops to comfort your throat as told by your doctor.  Use a warm mist humidifier or inhale steam from a shower to increase air moisture. This may make it easier to breathe.  Drink enough fluid to keep your pee (urine) clear or pale yellow.  Eat soups and other clear broths.  Have a healthy diet.  Rest as needed.  Go back to work when your fever is gone or your doctor says it is okay. ? You may need to stay home longer to avoid giving your URI to others. ? You can also wear a face mask and wash your hands often to prevent spread of the virus.  Use your inhaler more if you have asthma.  Do not use any tobacco products, including cigarettes, chewing tobacco, or electronic cigarettes. If you need help quitting, ask your doctor. Contact a doctor if:  You are getting worse, not better.  Your symptoms are not helped by medicine.  You have chills.  You are getting more short of breath.  You have brown or red mucus.  You have yellow or brown discharge from your nose.  You have pain in your face, especially when you bend forward.  You have a fever.  You have puffy (swollen) neck glands.  You have pain while swallowing.  You have white areas in the back of your throat. Get help right away if:  You have very bad or constant: ? Headache. ? Ear pain. ? Pain in your forehead, behind your eyes, and over your cheekbones (sinus pain). ? Chest pain.  You have long-lasting (chronic) lung disease and any of the following: ? Wheezing. ? Long-lasting cough. ? Coughing up blood. ? A change in your usual mucus.  You have a stiff neck.  You have  changes in your: ? Vision. ? Hearing. ? Thinking. ? Mood. This information is not intended to replace advice given to you by your health care provider. Make sure you discuss any questions you have with your health care provider. Document Released: 11/16/2007 Document Revised: 01/31/2016 Document Reviewed: 09/04/2013 Elsevier Interactive Patient Education  2018 Elsevier Inc.  

## 2017-04-24 ENCOUNTER — Encounter: Payer: Self-pay | Admitting: Physician Assistant

## 2017-04-24 ENCOUNTER — Ambulatory Visit (INDEPENDENT_AMBULATORY_CARE_PROVIDER_SITE_OTHER): Payer: Federal, State, Local not specified - PPO | Admitting: Physician Assistant

## 2017-04-24 VITALS — BP 104/68 | HR 72 | Temp 97.9°F | Resp 16 | Wt 147.0 lb

## 2017-04-24 DIAGNOSIS — Z3201 Encounter for pregnancy test, result positive: Secondary | ICD-10-CM

## 2017-04-24 DIAGNOSIS — N912 Amenorrhea, unspecified: Secondary | ICD-10-CM

## 2017-04-24 NOTE — Patient Instructions (Addendum)
First Trimester of Pregnancy The first trimester of pregnancy is from week 1 until the end of week 13 (months 1 through 3). During this time, your baby will begin to develop inside you. At 6-8 weeks, the eyes and face are formed, and the heartbeat can be seen on ultrasound. At the end of 12 weeks, all the baby's organs are formed. Prenatal care is all the medical care you receive before the birth of your baby. Make sure you get good prenatal care and follow all of your doctor's instructions. Follow these instructions at home: Medicines  Take over-the-counter and prescription medicines only as told by your doctor. Some medicines are safe and some medicines are not safe during pregnancy.  Take a prenatal vitamin that contains at least 600 micrograms (mcg) of folic acid.  If you have trouble pooping (constipation), take medicine that will make your stool soft (stool softener) if your doctor approves. Eating and drinking  Eat regular, healthy meals.  Your doctor will tell you the amount of weight gain that is right for you.  Avoid raw meat and uncooked cheese.  If you feel sick to your stomach (nauseous) or throw up (vomit): ? Eat 4 or 5 small meals a day instead of 3 large meals. ? Try eating a few soda crackers. ? Drink liquids between meals instead of during meals.  To prevent constipation: ? Eat foods that are high in fiber, like fresh fruits and vegetables, whole grains, and beans. ? Drink enough fluids to keep your pee (urine) clear or pale yellow. Activity  Exercise only as told by your doctor. Stop exercising if you have cramps or pain in your lower belly (abdomen) or low back.  Do not exercise if it is too hot, too humid, or if you are in a place of great height (high altitude).  Try to avoid standing for long periods of time. Move your legs often if you must stand in one place for a long time.  Avoid heavy lifting.  Wear low-heeled shoes. Sit and stand up straight.  You  can have sex unless your doctor tells you not to. Relieving pain and discomfort  Wear a good support bra if your breasts are sore.  Take warm water baths (sitz baths) to soothe pain or discomfort caused by hemorrhoids. Use hemorrhoid cream if your doctor says it is okay.  Rest with your legs raised if you have leg cramps or low back pain.  If you have puffy, bulging veins (varicose veins) in your legs: ? Wear support hose or compression stockings as told by your doctor. ? Raise (elevate) your feet for 15 minutes, 3-4 times a day. ? Limit salt in your food. Prenatal care  Schedule your prenatal visits by the twelfth week of pregnancy.  Write down your questions. Take them to your prenatal visits.  Keep all your prenatal visits as told by your doctor. This is important. Safety  Wear your seat belt at all times when driving.  Make a list of emergency phone numbers. The list should include numbers for family, friends, the hospital, and police and fire departments. General instructions  Ask your doctor for a referral to a local prenatal class. Begin classes no later than at the start of month 6 of your pregnancy.  Ask for help if you need counseling or if you need help with nutrition. Your doctor can give you advice or tell you where to go for help.  Do not use hot tubs, steam rooms, or   saunas.  Do not douche or use tampons or scented sanitary pads.  Do not cross your legs for long periods of time.  Avoid all herbs and alcohol. Avoid drugs that are not approved by your doctor.  Do not use any tobacco products, including cigarettes, chewing tobacco, and electronic cigarettes. If you need help quitting, ask your doctor. You may get counseling or other support to help you quit.  Avoid cat litter boxes and soil used by cats. These carry germs that can cause birth defects in the baby and can cause a loss of your baby (miscarriage) or stillbirth.  Visit your dentist. At home, brush  your teeth with a soft toothbrush. Be gentle when you floss. Contact a doctor if:  You are dizzy.  You have mild cramps or pressure in your lower belly.  You have a nagging pain in your belly area.  You continue to feel sick to your stomach, you throw up, or you have watery poop (diarrhea).  You have a bad smelling fluid coming from your vagina.  You have pain when you pee (urinate).  You have increased puffiness (swelling) in your face, hands, legs, or ankles. Get help right away if:  You have a fever.  You are leaking fluid from your vagina.  You have spotting or bleeding from your vagina.  You have very bad belly cramping or pain.  You gain or lose weight rapidly.  You throw up blood. It may look like coffee grounds.  You are around people who have German measles, fifth disease, or chickenpox.  You have a very bad headache.  You have shortness of breath.  You have any kind of trauma, such as from a fall or a car accident. Summary  The first trimester of pregnancy is from week 1 until the end of week 13 (months 1 through 3).  To take care of yourself and your unborn baby, you will need to eat healthy meals, take medicines only if your doctor tells you to do so, and do activities that are safe for you and your baby.  Keep all follow-up visits as told by your doctor. This is important as your doctor will have to ensure that your baby is healthy and growing well. This information is not intended to replace advice given to you by your health care provider. Make sure you discuss any questions you have with your health care provider. Document Released: 11/16/2007 Document Revised: 06/07/2016 Document Reviewed: 06/07/2016 Elsevier Interactive Patient Education  2017 Elsevier Inc.  

## 2017-04-24 NOTE — Progress Notes (Signed)
Patient: Haley Russell Female    DOB: Oct 15, 1995   21 y.o.   MRN: [id number] Visit Date: 04/24/2017  Today's Provider: Trey SailorsAdriana M Pollak, PA-C   Chief Complaint  Patient presents with  . Nausea    Started a few days ago.  . Emesis   Subjective:    Emesis   This is a new problem. The current episode started in the past 7 days. The problem has been gradually improving (Pt stopped vomiting yesterday.  But is still feeling nausea). The fever has been present for 1 to 2 days (Pt report she has not taking her temperature but "feels like she had a fever"). Associated symptoms include coughing, diarrhea and a fever. Pertinent negatives include no abdominal pain, chills, dizziness or headaches.   LMP was sometime in September. She has been sexually active with multiple partners. No abdominal cramping.     No Known Allergies   Current Outpatient Medications:  .  AFLURIA QUADRIVALENT 0.5 ML injection, inject 0.5 milliliter intramuscularly, Disp: , Rfl: 0  Review of Systems  Constitutional: Positive for activity change, fatigue and fever. Negative for appetite change, chills, diaphoresis and unexpected weight change.  Respiratory: Positive for cough, chest tightness, shortness of breath and wheezing. Negative for apnea, choking and stridor.   Gastrointestinal: Positive for diarrhea, nausea and vomiting. Negative for abdominal distention, abdominal pain, anal bleeding, blood in stool, constipation and rectal pain.  Neurological: Positive for light-headedness. Negative for dizziness and headaches.    Social History   Tobacco Use  . Smoking status: Current Every Day Smoker    Packs/day: 1.50    Types: Cigarettes  . Smokeless tobacco: Never Used  Substance Use Topics  . Alcohol use: Not on file   Objective:   LMP 02/13/2017  There were no vitals filed for this visit.   Physical Exam  Constitutional: She is oriented to person, place, and time. She appears well-developed  and well-nourished.  Cardiovascular: Normal rate and regular rhythm.  Pulmonary/Chest: Effort normal and breath sounds normal.  Abdominal: Soft. Bowel sounds are normal. She exhibits no distension. There is no tenderness.  Neurological: She is alert and oriented to person, place, and time.  Skin: Skin is warm and dry.  Psychiatric: She has a normal mood and affect. Her behavior is normal.        Assessment & Plan:     1. Amenorrhea  Urine pregnancy positive today. She does not know the exact date of her last period. Beta HCG consistent with 2-5 weeks of pregnancy. Counseled on tobacco cessation - she currently smokes 1.5 pack per day. Counseled on fetal risk with nicotine exposure. She declines cessation aids. Counseled on taking daily prenatal vitamin. Counseled on not take unnecessary medications, Tylenol is safe. Should see OBGYN for follow up and ultrasound.   - B-HCG Quant - Ambulatory referral to Obstetrics / Gynecology - POCT urine pregnancy  2. Positive urine pregnancy test  - B-HCG Quant - Ambulatory referral to Obstetrics / Gynecology  Return if symptoms worsen or fail to improve.  The entirety of the information documented in the History of Present Illness, Review of Systems and Physical Exam were personally obtained by me. Portions of this information were initially documented by Kavin LeechLaura Walsh, CMA and reviewed by me for thoroughness and accuracy.   I have spent 25 minutes with this patient, >50% of which was spent on counseling and coordination of care.       Trey SailorsAdriana M Pollak,  PA-C  [facility]

## 2017-04-25 ENCOUNTER — Telehealth: Payer: Self-pay

## 2017-04-25 LAB — POCT URINE PREGNANCY: Preg Test, Ur: POSITIVE — AB

## 2017-04-25 LAB — HCG, QUANTITATIVE, PREGNANCY: HCG, Total, QN: 3059 m[IU]/mL

## 2017-04-25 NOTE — Telephone Encounter (Signed)
-----   Message from Trey SailorsAdriana M Pollak, [place]PA-C sent at 04/25/2017  8:23 AM EST ----- HCG consistent with 2-5 weeks of pregnancy.

## 2017-04-25 NOTE — Telephone Encounter (Signed)
Pt advised.   Thanks,   -Abriel Hattery  

## 2017-05-03 ENCOUNTER — Encounter: Payer: Self-pay | Admitting: Emergency Medicine

## 2017-05-03 ENCOUNTER — Other Ambulatory Visit: Payer: Self-pay

## 2017-05-03 ENCOUNTER — Emergency Department
Admission: EM | Admit: 2017-05-03 | Discharge: 2017-05-03 | Disposition: A | Discharge status: Home / Self Care | Payer: Self-pay | Attending: Emergency Medicine | Admitting: Emergency Medicine

## 2017-05-03 DIAGNOSIS — Z3A00 Weeks of gestation of pregnancy not specified: Secondary | ICD-10-CM | POA: Insufficient documentation

## 2017-05-03 DIAGNOSIS — O219 Vomiting of pregnancy, unspecified: Secondary | ICD-10-CM | POA: Insufficient documentation

## 2017-05-03 DIAGNOSIS — O99330 Smoking (tobacco) complicating pregnancy, unspecified trimester: Secondary | ICD-10-CM | POA: Insufficient documentation

## 2017-05-03 DIAGNOSIS — F17210 Nicotine dependence, cigarettes, uncomplicated: Secondary | ICD-10-CM | POA: Insufficient documentation

## 2017-05-03 LAB — COMPREHENSIVE METABOLIC PANEL
ALK PHOS: 63 U/L (ref 38–126)
ALT: 17 U/L (ref 14–54)
AST: 24 U/L (ref 15–41)
Albumin: 4.3 g/dL (ref 3.5–5.0)
Anion gap: 12 (ref 5–15)
BUN: 12 mg/dL (ref 6–20)
CALCIUM: 9.3 mg/dL (ref 8.9–10.3)
CO2: 19 mmol/L — ABNORMAL LOW (ref 22–32)
CREATININE: 0.6 mg/dL (ref 0.44–1.00)
Chloride: 103 mmol/L (ref 101–111)
Glucose, Bld: 95 mg/dL (ref 65–99)
Potassium: 3.6 mmol/L (ref 3.5–5.1)
Sodium: 134 mmol/L — ABNORMAL LOW (ref 135–145)
Total Bilirubin: 0.7 mg/dL (ref 0.3–1.2)
Total Protein: 7.8 g/dL (ref 6.5–8.1)

## 2017-05-03 LAB — CBC
HCT: 43 % (ref 35.0–47.0)
Hemoglobin: 14.7 g/dL (ref 12.0–16.0)
MCH: 29.3 pg (ref 26.0–34.0)
MCHC: 34.1 g/dL (ref 32.0–36.0)
MCV: 85.9 fL (ref 80.0–100.0)
PLATELETS: 247 10*3/uL (ref 150–440)
RBC: 5.01 MIL/uL (ref 3.80–5.20)
RDW: 13.6 % (ref 11.5–14.5)
WBC: 7.2 10*3/uL (ref 3.6–11.0)

## 2017-05-03 LAB — LIPASE, BLOOD: LIPASE: 21 U/L (ref 11–51)

## 2017-05-03 MED ORDER — SODIUM CHLORIDE 0.9 % IV BOLUS (SEPSIS)
1000.0000 mL | Freq: Once | INTRAVENOUS | Status: AC
  Administered 2017-05-03: 1000 mL via INTRAVENOUS

## 2017-05-03 MED ORDER — METOCLOPRAMIDE HCL 5 MG/ML IJ SOLN
INTRAMUSCULAR | Status: AC
  Filled 2017-05-03: qty 2

## 2017-05-03 MED ORDER — METOCLOPRAMIDE HCL 10 MG PO TABS: 10.0000 mg | ORAL_TABLET | Freq: Three times a day (TID) | ORAL | 0 refills | Status: DC | PRN

## 2017-05-03 MED ORDER — METOCLOPRAMIDE HCL 5 MG/ML IJ SOLN
10.0000 mg | Freq: Once | INTRAMUSCULAR | Status: AC
  Administered 2017-05-03: 10 mg via INTRAVENOUS

## 2017-05-03 NOTE — ED Triage Notes (Signed)
Positive preg approx 3-6 weeks , with nausea and vomiting , lack of appetite.

## 2017-05-03 NOTE — ED Provider Notes (Signed)
[facility] Emergency Department Provider Note  ____________________________________________   I have reviewed the triage vital signs and the nursing notes.   HISTORY  Chief Complaint Emesis During Pregnancy   History limited by: Not Limited   HPI Haley Russell is a 21 y.o. female who presents to the emergency department today because of concern for vomiting in early pregnancy.  DURATION:roughly 1 week TIMING: intermittent SEVERITY: states has had decreased intake due to symptoms CONTEXT: patient states she is early in her pregnancy. Started developing nausea and vomiting with decreased oral intake. MODIFYING FACTORS: worse with ingestion ASSOCIATED SYMPTOMS: some abdominal discomfort. Denies fevers. Denies diarrhea  Per medical record review no pertinent history.  History reviewed. No pertinent past medical history.  Patient Active Problem List   Diagnosis Date Noted  . Cephalalgia 01/20/2015  . Insomnia 01/20/2015  . Episodic mood disorder (HCC) 01/20/2015  . Muscle ache 01/20/2015  . Problems related to high-risk sexual behavior 01/20/2015  . Alcohol drinker 01/20/2015    History reviewed. No pertinent surgical history.  Prior to Admission medications   Medication Sig Start Date End Date Taking? Authorizing Provider  AFLURIA QUADRIVALENT 0.5 ML injection inject 0.5 milliliter intramuscularly 02/09/17   [provider]  metoCLOPramide (REGLAN) 10 MG tablet Take 1 tablet (10 mg total) by mouth every 8 (eight) hours as needed for nausea or vomiting. 05/03/17 05/03/18  Phineas SemenGoodman, Frankee Gritz, MD    Allergies Patient has no known allergies.  Family History  Problem Relation Age of Onset  . Alcohol abuse Mother   . Cancer Father        lung    Social History Social History   Tobacco Use  . Smoking status: Current Every Day Smoker    Packs/day: 1.50    Types: Cigarettes  . Smokeless tobacco: Never Used  Substance Use Topics   . Alcohol use: No    Alcohol/week: 0.0 oz    Frequency: Never  . Drug use: No    Review of Systems Constitutional: No fever/chills Eyes: No visual changes. ENT: No sore throat. Cardiovascular: Denies chest pain. Respiratory: Denies shortness of breath. Gastrointestinal: Positive for abdominal pain, nausea and vomiting.  Genitourinary: Negative for dysuria. Musculoskeletal: Negative for back pain. Skin: Negative for rash. Neurological: Negative for headaches, focal weakness or numbness.  ____________________________________________   PHYSICAL EXAM:  VITAL SIGNS: ED Triage Vitals [05/03/17 0959]  Enc Vitals Group     BP 124/81     Pulse Rate 85     Resp 18     Temp (!) 97.4 F (36.3 C)     Temp Source Oral     SpO2 100 %     Weight 147 lb (66.7 kg)     Height 5\' 4"  (1.626 m)   Constitutional: Alert and oriented. Well appearing and in no distress. Eyes: Conjunctivae are normal.  ENT   Head: Normocephalic and atraumatic.   Nose: No congestion/rhinnorhea.   Mouth/Throat: Mucous membranes are moist.   Neck: No stridor. Hematological/Lymphatic/Immunilogical: No cervical lymphadenopathy. Cardiovascular: Normal rate, regular rhythm.  No murmurs, rubs, or gallops.  Respiratory: Normal respiratory effort without tachypnea nor retractions. Breath sounds are clear and equal bilaterally. No wheezes/rales/rhonchi. Gastrointestinal: Soft and non tender. No rebound. No guarding.  Genitourinary: Deferred Musculoskeletal: Normal range of motion in all extremities. No lower extremity edema. Neurologic:  Normal speech and language. No gross focal neurologic deficits are appreciated.  Skin:  Skin is warm, dry and intact. No rash noted. Psychiatric: Mood and  affect are normal. Speech and behavior are normal. Patient exhibits appropriate insight and judgment.  ____________________________________________    LABS (pertinent positives/negatives)  Lipase 21 CBC wnl CMP  na 134, k 3.6, cr 0.60  ____________________________________________   EKG  None  ____________________________________________    RADIOLOGY  None  ____________________________________________   PROCEDURES  Procedures  ____________________________________________   INITIAL IMPRESSION / ASSESSMENT AND PLAN / ED COURSE  Pertinent labs & imaging results that were available during my care of the patient were reviewed by me and considered in my medical decision making (see chart for details).  Patient with vomiting early in pregnancy. Concern for malnutrion, dehydration, significant infection amongst other concerns. Lab work without concerning findings. She did feel better after medication and was able to tolerate PO. Discussed importance of prenatal vitamins with patient, plan and follow up importance.  ____________________________________________   FINAL CLINICAL IMPRESSION(S) / ED DIAGNOSES  Final diagnoses:  Vomiting pregnancy     Note: This dictation was prepared with Dragon dictation. Any transcriptional errors that result from this process are unintentional     Phineas SemenGoodman, Simon Aaberg, MD 05/03/17 [phone]

## 2017-05-03 NOTE — ED Notes (Signed)
Given sprite for PO challenge 

## 2017-05-03 NOTE — ED Notes (Signed)
Reports nausea is improving.

## 2017-05-03 NOTE — Discharge Instructions (Signed)
Please seek medical attention for any high fevers, chest pain, shortness of breath, change in behavior, persistent vomiting, bloody stool or any other new or concerning symptoms.  

## 2017-05-08 ENCOUNTER — Other Ambulatory Visit: Payer: Self-pay

## 2017-05-08 ENCOUNTER — Ambulatory Visit (INDEPENDENT_AMBULATORY_CARE_PROVIDER_SITE_OTHER): Payer: Federal, State, Local not specified - PPO | Admitting: Obstetrics and Gynecology

## 2017-05-08 VITALS — BP 101/57 | HR 57 | Ht 64.0 in | Wt 145.3 lb

## 2017-05-08 DIAGNOSIS — Z113 Encounter for screening for infections with a predominantly sexual mode of transmission: Secondary | ICD-10-CM | POA: Diagnosis not present

## 2017-05-08 DIAGNOSIS — Z3401 Encounter for supervision of normal first pregnancy, first trimester: Secondary | ICD-10-CM | POA: Diagnosis not present

## 2017-05-08 DIAGNOSIS — Z1389 Encounter for screening for other disorder: Secondary | ICD-10-CM

## 2017-05-08 DIAGNOSIS — Z3687 Encounter for antenatal screening for uncertain dates: Secondary | ICD-10-CM

## 2017-05-08 MED ORDER — PROMETHAZINE HCL 25 MG PO TABS: 25.0000 mg | ORAL_TABLET | Freq: Four times a day (QID) | ORAL | 0 refills | Status: DC | PRN

## 2017-05-08 MED ORDER — VITAMIN B-6 25 MG PO TABS: 25.0000 mg | ORAL_TABLET | Freq: Three times a day (TID) | ORAL | 3 refills | Status: DC

## 2017-05-08 MED ORDER — DOXYLAMINE SUCCINATE (SLEEP) 25 MG PO TABS: 25.0000 mg | ORAL_TABLET | Freq: Every day | ORAL | 0 refills | Status: DC

## 2017-05-08 NOTE — Progress Notes (Signed)
Clide DalesAdriana Russell presents for NOB nurse interview visit. Pregnancy confirmation done 04/24/2017 [facility]. UPT: POSITIVE.  LMP 02/13/2017. EGA: 12 wks , EDD; 11/20/2016 by LMP. Ultrasound ordered for dating and viability because pt unsure about LMP.   G-1.  P-0000. Pregnancy education material explained and given.  No  cats in the home. NOB labs ordered.  HIV labs and Drug screen were explained optional and she did not decline. Drug screen ordered. PNV encouraged. Genetic screening options discussed. Genetic testing: Unsure.  Pt may discuss with provider. Pt. To follow up with provider will be determine after ultrasound for dating and viability,  for NOB physical.  Pt having lots of n/v. Was given Reglan at hospital but midwife wants pt to try vitamin B6 3x day and unisom at night, if that doesn't work, possibly some Antivert. Reglan is last resort. If pt has to have something she may take 1/2 tab phenergan if needed, #5 ordered. All questions answered.

## 2017-05-08 NOTE — Patient Instructions (Signed)
Pregnancy and Zika Virus Disease Zika virus disease, or Zika, is an illness that can spread to people from mosquitoes that carry the virus. It may also spread from person to person through infected body fluids. Zika first occurred in [place], but recently it has spread to new areas. The virus occurs in tropical climates. The location of Zika continues to change. Most people who become infected with Zika virus do not develop serious illness. However, Zika may cause birth defects in an unborn baby whose mother is infected with the virus. It may also increase the risk of miscarriage. What are the symptoms of Zika virus disease? In many cases, people who have been infected with Zika virus do not develop any symptoms. If symptoms appear, they usually start about a week after the person is infected. Symptoms are usually mild. They may include:  Fever.  Rash.  Red eyes.  Joint pain.  How does Zika virus disease spread? The main way that Zika virus spreads is through the bite of a certain type of mosquito. Unlike most types of mosquitos, which bite only at night, the type of mosquito that carries Zika virus bites both at night and during the day. Zika virus can also spread through sexual contact, through a blood transfusion, and from a mother to her baby before or during birth. Once you have had Zika virus disease, it is unlikely that you will get it again. Can I pass Zika to my baby during pregnancy? Yes, Zika can pass from a mother to her baby before or during birth. What problems can Zika cause for my baby? A woman who is infected with Zika virus while pregnant is at risk of having her baby born with a condition in which the brain or head is smaller than expected (microcephaly). Babies who have microcephaly can have developmental delays, seizures, hearing problems, and vision problems. Having Zika virus disease during pregnancy can also increase the risk of miscarriage. How can Zika virus disease be  prevented? There is no vaccine to prevent Zika. The best way to prevent the disease is to avoid infected mosquitoes and avoid exposure to body fluids that can spread the virus. Avoid any possible exposure to Zika by taking the following precautions. For women and their sex partners:  Avoid traveling to high-risk areas. The locations where Zika is being reported change often. To identify high-risk areas, check the CDC travel website: www.cdc.gov/zika/geo/index.html  If you or your sex partner must travel to a high-risk area, talk with a health care provider before and after traveling.  Take all precautions to avoid mosquito bites if you live in, or travel to, any of the high-risk areas. Insect repellents are safe to use during pregnancy.  Ask your health care provider when it is safe to have sexual contact.  For women:  If you are pregnant or trying to become pregnant, avoid sexual contact with persons who may have been exposed to Zika virus, persons who have possible symptoms of Zika, or persons whose history you are unsure about. If you choose to have sexual contact with someone who may have been exposed to Zika virus, use condoms correctly during the entire duration of sexual activity, every time. Do not share sexual devices, as you may be exposed to body fluids.  Ask your health care provider about when it is safe to attempt pregnancy after a possible exposure to Zika virus.  What steps should I take to avoid mosquito bites? Take these steps to avoid mosquito bites   when you are in a high-risk area:  Wear loose clothing that covers your arms and legs.  Limit your outdoor activities.  Do not open windows unless they have window screens.  Sleep under mosquito nets.  Use insect repellent. The best insect repellents have:  DEET, picaridin, oil of lemon eucalyptus (OLE), or IR3535 in them.  Higher amounts of an active ingredient in them.  Remember that insect repellents are safe to  use during pregnancy.  Do not use OLE on children who are younger than 3 years of age. Do not use insect repellent on babies who are younger than 2 months of age.  Cover your child's stroller with mosquito netting. Make sure the netting fits snugly and that any loose netting does not cover your child's mouth or nose. Do not use a blanket as a mosquito-protection cover.  Do not apply insect repellent underneath clothing.  If you are using sunscreen, apply the sunscreen before applying the insect repellent.  Treat clothing with permethrin. Do not apply permethrin directly to your skin. Follow label directions for safe use.  Get rid of standing water, where mosquitoes may reproduce. Standing water is often found in items such as buckets, bowls, animal food dishes, and flowerpots.  When you return from traveling to any high-risk area, continue taking actions to protect yourself against mosquito bites for 3 weeks, even if you show no signs of illness. This will prevent spreading Zika virus to uninfected mosquitoes. What should I know about the sexual transmission of Zika? People can spread Zika to their sexual partners during vaginal, anal, or oral sex, or by sharing sexual devices. Many people with Zika do not develop symptoms, so a person could spread the disease without knowing that they are infected. The greatest risk is to women who are pregnant or who may become pregnant. Zika virus can live longer in semen than it can live in blood. Couples can prevent sexual transmission of the virus by:  Using condoms correctly during the entire duration of sexual activity, every time. This includes vaginal, anal, and oral sex.  Not sharing sexual devices. Sharing increases your risk of being exposed to body fluid from another person.  Avoiding all sexual activity until your health care provider says it is safe.  Should I be tested for Zika virus? A sample of your blood can be tested for Zika virus. A  pregnant woman should be tested if she may have been exposed to the virus or if she has symptoms of Zika. She may also have additional tests done during her pregnancy, such ultrasound testing. Talk with your health care provider about which tests are recommended. This information is not intended to replace advice given to you by your health care provider. Make sure you discuss any questions you have with your health care provider. Document Released: 02/18/2015 Document Revised: 11/05/2015 Document Reviewed: 02/11/2015 Elsevier Interactive Patient Education  2018 [employer] Hyperemesis Gravidarum Hyperemesis gravidarum is a severe form of nausea and vomiting that happens during pregnancy. Hyperemesis is worse than morning sickness. It may cause you to have nausea or vomiting all day for many days. It may keep you from eating and drinking enough food and liquids. Hyperemesis usually occurs during the first half (the first 20 weeks) of pregnancy. It often goes away once a woman is in her second half of pregnancy. However, sometimes hyperemesis continues through an entire pregnancy. What are the causes? The cause of this condition is not known. It may be related   to changes in chemicals (hormones) in the body during pregnancy, such as the high level of pregnancy hormone (human chorionic gonadotropin) or the increase in the female sex hormone (estrogen). What are the signs or symptoms? Symptoms of this condition include:  Severe nausea and vomiting.  Nausea that does not go away.  Vomiting that does not allow you to keep any food down.  Weight loss.  Body fluid loss (dehydration).  Having no desire to eat, or not liking food that you have previously enjoyed.  How is this diagnosed? This condition may be diagnosed based on:  A physical exam.  Your medical history.  Your symptoms.  Blood tests.  Urine tests.  How is this treated? This condition may be managed with medicine. If  medicines to do not help relieve nausea and vomiting, you may need to receive fluids through an IV tube at the hospital. Follow these instructions at home:  Take over-the-counter and prescription medicines only as told by your health care provider.  Avoid iron pills and multivitamins that contain iron for the first 3-4 months of pregnancy. If you take prescription iron pills, do not stop taking them unless your health care provider approves.  Take the following actions to help prevent nausea and vomiting: ? In the morning, before getting out of bed, try eating a couple of dry crackers or a piece of toast. ? Avoid foods and smells that upset your stomach. Fatty and spicy foods may make nausea worse. ? Eat 5-6 small meals a day. ? Do not drink fluids while eating meals. Drink between meals. ? Eat or suck on things that have ginger in them. Ginger can help relieve nausea. ? Avoid food preparation. The smell of food can spoil your appetite or trigger nausea.  Follow instructions from your health care provider about eating or drinking restrictions.  For snacks, eat high-protein foods, such as cheese.  Keep all follow-up and pre-birth (prenatal) visits as told by your health care provider. This is important. Contact a health care provider if:  You have pain in your abdomen.  You have a severe headache.  You have vision problems.  You are losing weight. Get help right away if:  You cannot drink fluids without vomiting.  You vomit blood.  You have constant nausea and vomiting.  You are very weak.  You are very thirsty.  You feel dizzy.  You faint.  You have a fever or other symptoms that last for more than 2-3 days.  You have a fever and your symptoms suddenly get worse. Summary  Hyperemesis gravidarum is a severe form of nausea and vomiting that happens during pregnancy.  Making some changes to your eating habits may help relieve nausea and vomiting.  This condition may  be managed with medicine.  If medicines to do not help relieve nausea and vomiting, you may need to receive fluids through an IV tube at the hospital. This information is not intended to replace advice given to you by your health care provider. Make sure you discuss any questions you have with your health care provider. Document Released: 05/30/2005 Document Revised: 01/27/2016 Document Reviewed: 01/27/2016 Elsevier Interactive Patient Education  2017 [employer] First Trimester of Pregnancy The first trimester of pregnancy is from week 1 until the end of week 13 (months 1 through 3). During this time, your baby will begin to develop inside you. At 6-8 weeks, the eyes and face are formed, and the heartbeat can be seen on ultrasound. At the   end of 12 weeks, all the baby's organs are formed. Prenatal care is all the medical care you receive before the birth of your baby. Make sure you get good prenatal care and follow all of your doctor's instructions. Follow these instructions at home: Medicines  Take over-the-counter and prescription medicines only as told by your doctor. Some medicines are safe and some medicines are not safe during pregnancy.  Take a prenatal vitamin that contains at least 600 micrograms (mcg) of folic acid.  If you have trouble pooping (constipation), take medicine that will make your stool soft (stool softener) if your doctor approves. Eating and drinking  Eat regular, healthy meals.  Your doctor will tell you the amount of weight gain that is right for you.  Avoid raw meat and uncooked cheese.  If you feel sick to your stomach (nauseous) or throw up (vomit): ? Eat 4 or 5 small meals a day instead of 3 large meals. ? Try eating a few soda crackers. ? Drink liquids between meals instead of during meals.  To prevent constipation: ? Eat foods that are high in fiber, like fresh fruits and vegetables, whole grains, and beans. ? Drink enough fluids to keep your pee  (urine) clear or pale yellow. Activity  Exercise only as told by your doctor. Stop exercising if you have cramps or pain in your lower belly (abdomen) or low back.  Do not exercise if it is too hot, too humid, or if you are in a place of great height (high altitude).  Try to avoid standing for long periods of time. Move your legs often if you must stand in one place for a long time.  Avoid heavy lifting.  Wear low-heeled shoes. Sit and stand up straight.  You can have sex unless your doctor tells you not to. Relieving pain and discomfort  Wear a good support bra if your breasts are sore.  Take warm water baths (sitz baths) to soothe pain or discomfort caused by hemorrhoids. Use hemorrhoid cream if your doctor says it is okay.  Rest with your legs raised if you have leg cramps or low back pain.  If you have puffy, bulging veins (varicose veins) in your legs: ? Wear support hose or compression stockings as told by your doctor. ? Raise (elevate) your feet for 15 minutes, 3-4 times a day. ? Limit salt in your food. Prenatal care  Schedule your prenatal visits by the twelfth week of pregnancy.  Write down your questions. Take them to your prenatal visits.  Keep all your prenatal visits as told by your doctor. This is important. Safety  Wear your seat belt at all times when driving.  Make a list of emergency phone numbers. The list should include numbers for family, friends, the hospital, and police and fire departments. General instructions  Ask your doctor for a referral to a local prenatal class. Begin classes no later than at the start of month 6 of your pregnancy.  Ask for help if you need counseling or if you need help with nutrition. Your doctor can give you advice or tell you where to go for help.  Do not use hot tubs, steam rooms, or saunas.  Do not douche or use tampons or scented sanitary pads.  Do not cross your legs for long periods of time.  Avoid all herbs  and alcohol. Avoid drugs that are not approved by your doctor.  Do not use any tobacco products, including cigarettes, chewing tobacco, and electronic cigarettes.   If you need help quitting, ask your doctor. You may get counseling or other support to help you quit.  Avoid cat litter boxes and soil used by cats. These carry germs that can cause birth defects in the baby and can cause a loss of your baby (miscarriage) or stillbirth.  Visit your dentist. At home, brush your teeth with a soft toothbrush. Be gentle when you floss. Contact a doctor if:  You are dizzy.  You have mild cramps or pressure in your lower belly.  You have a nagging pain in your belly area.  You continue to feel sick to your stomach, you throw up, or you have watery poop (diarrhea).  You have a bad smelling fluid coming from your vagina.  You have pain when you pee (urinate).  You have increased puffiness (swelling) in your face, hands, legs, or ankles. Get help right away if:  You have a fever.  You are leaking fluid from your vagina.  You have spotting or bleeding from your vagina.  You have very bad belly cramping or pain.  You gain or lose weight rapidly.  You throw up blood. It may look like coffee grounds.  You are around people who have German measles, fifth disease, or chickenpox.  You have a very bad headache.  You have shortness of breath.  You have any kind of trauma, such as from a fall or a car accident. Summary  The first trimester of pregnancy is from week 1 until the end of week 13 (months 1 through 3).  To take care of yourself and your unborn baby, you will need to eat healthy meals, take medicines only if your doctor tells you to do so, and do activities that are safe for you and your baby.  Keep all follow-up visits as told by your doctor. This is important as your doctor will have to ensure that your baby is healthy and growing well. This information is not intended to replace  advice given to you by your health care provider. Make sure you discuss any questions you have with your health care provider. Document Released: 11/16/2007 Document Revised: 06/07/2016 Document Reviewed: 06/07/2016 Elsevier Interactive Patient Education  2017 [employer] Commonly Asked Questions During Pregnancy  Cats: A parasite can be excreted in cat feces.  To avoid exposure you need to have another person empty the little box.  If you must empty the litter box you will need to wear gloves.  Wash your hands after handling your cat.  This parasite can also be found in raw or undercooked meat so this should also be avoided.  Colds, Sore Throats, Flu: Please check your medication sheet to see what you can take for symptoms.  If your symptoms are unrelieved by these medications please call the office.  Dental Work: Most any dental work your dentist recommends is permitted.  X-rays should only be taken during the first trimester if absolutely necessary.  Your abdomen should be shielded with a lead apron during all x-rays.  Please notify your provider prior to receiving any x-rays.  Novocaine is fine; gas is not recommended.  If your dentist requires a note from us prior to dental work please call the office and we will provide one for you.  Exercise: Exercise is an important part of staying healthy during your pregnancy.  You may continue most exercises you were accustomed to prior to pregnancy.  Later in your pregnancy you will most likely notice you have difficulty with activities   requiring balance like riding a bicycle.  It is important that you listen to your body and avoid activities that put you at a higher risk of falling.  Adequate rest and staying well hydrated are a must!  If you have questions about the safety of specific activities ask your provider.    Exposure to Children with illness: Try to avoid obvious exposure; report any symptoms to us when noted,  If you have chicken pos, red  measles or mumps, you should be immune to these diseases.   Please do not take any vaccines while pregnant unless you have checked with your OB provider.  Fetal Movement: After 28 weeks we recommend you do "kick counts" twice daily.  Lie or sit down in a calm quiet environment and count your baby movements "kicks".  You should feel your baby at least 10 times per hour.  If you have not felt 10 kicks within the first hour get up, walk around and have something sweet to eat or drink then repeat for an additional hour.  If count remains less than 10 per hour notify your provider.  Fumigating: Follow your pest control agent's advice as to how long to stay out of your home.  Ventilate the area well before re-entering.  Hemorrhoids:   Most over-the-counter preparations can be used during pregnancy.  Check your medication to see what is safe to use.  It is important to use a stool softener or fiber in your diet and to drink lots of liquids.  If hemorrhoids seem to be getting worse please call the office.   Hot Tubs:  Hot tubs Jacuzzis and saunas are not recommended while pregnant.  These increase your internal body temperature and should be avoided.  Intercourse:  Sexual intercourse is safe during pregnancy as long as you are comfortable, unless otherwise advised by your provider.  Spotting may occur after intercourse; report any bright red bleeding that is heavier than spotting.  Labor:  If you know that you are in labor, please go to the hospital.  If you are unsure, please call the office and let us help you decide what to do.  Lifting, straining, etc:  If your job requires heavy lifting or straining please check with your provider for any limitations.  Generally, you should not lift items heavier than that you can lift simply with your hands and arms (no back muscles)  Painting:  Paint fumes do not harm your pregnancy, but may make you ill and should be avoided if possible.  Latex or water based paints  have less odor than oils.  Use adequate ventilation while painting.  Permanents & Hair Color:  Chemicals in hair dyes are not recommended as they cause increase hair dryness which can increase hair loss during pregnancy.  " Highlighting" and permanents are allowed.  Dye may be absorbed differently and permanents may not hold as well during pregnancy.  Sunbathing:  Use a sunscreen, as skin burns easily during pregnancy.  Drink plenty of fluids; avoid over heating.  Tanning Beds:  Because their possible side effects are still unknown, tanning beds are not recommended.  Ultrasound Scans:  Routine ultrasounds are performed at approximately 20 weeks.  You will be able to see your baby's general anatomy an if you would like to know the gender this can usually be determined as well.  If it is questionable when you conceived you may also receive an ultrasound early in your pregnancy for dating purposes.  Otherwise ultrasound exams   are not routinely performed unless there is a medical necessity.  Although you can request a scan we ask that you pay for it when conducted because insurance does not cover " patient request" scans.  Work: If your pregnancy proceeds without complications you may work until your due date, unless your physician or employer advises otherwise.  Round Ligament Pain/Pelvic Discomfort:  Sharp, shooting pains not associated with bleeding are fairly common, usually occurring in the second trimester of pregnancy.  They tend to be worse when standing up or when you remain standing for long periods of time.  These are the result of pressure of certain pelvic ligaments called "round ligaments".  Rest, Tylenol and heat seem to be the most effective relief.  As the womb and fetus grow, they rise out of the pelvis and the discomfort improves.  Please notify the office if your pain seems different than that described.  It may represent a more serious condition.   

## 2017-05-09 LAB — CBC WITH DIFFERENTIAL/PLATELET
Basophils Absolute: 0 10*3/uL (ref 0.0–0.2)
Basos: 0 %
EOS (ABSOLUTE): 0.2 10*3/uL (ref 0.0–0.4)
EOS: 2 %
HEMATOCRIT: 38.9 % (ref 34.0–46.6)
Hemoglobin: 13 g/dL (ref 11.1–15.9)
IMMATURE GRANULOCYTES: 0 %
Immature Grans (Abs): 0 10*3/uL (ref 0.0–0.1)
Lymphocytes Absolute: 2.7 10*3/uL (ref 0.7–3.1)
Lymphs: 27 %
MCH: 29.2 pg (ref 26.6–33.0)
MCHC: 33.4 g/dL (ref 31.5–35.7)
MCV: 87 fL (ref 79–97)
MONOCYTES: 6 %
MONOS ABS: 0.6 10*3/uL (ref 0.1–0.9)
NEUTROS PCT: 65 %
Neutrophils Absolute: 6.5 10*3/uL (ref 1.4–7.0)
Platelets: 285 10*3/uL (ref 150–379)
RBC: 4.45 x10E6/uL (ref 3.77–5.28)
RDW: 14 % (ref 12.3–15.4)
WBC: 10.1 10*3/uL (ref 3.4–10.8)

## 2017-05-09 LAB — ANTIBODY SCREEN: Antibody Screen: NEGATIVE

## 2017-05-09 LAB — VARICELLA ZOSTER ANTIBODY, IGG: Varicella zoster IgG: <135 index — ABNORMAL LOW (ref >165)

## 2017-05-09 LAB — RH TYPE: Rh Factor: NEGATIVE

## 2017-05-09 LAB — URINALYSIS, ROUTINE W REFLEX MICROSCOPIC
Bilirubin, UA: NEGATIVE
Glucose, UA: NEGATIVE
KETONES UA: NEGATIVE
Leukocytes, UA: NEGATIVE
NITRITE UA: NEGATIVE
Protein, UA: NEGATIVE
RBC, UA: NEGATIVE
Specific Gravity, UA: 1.022 (ref 1.005–1.030)
Urobilinogen, Ur: 0.2 mg/dL (ref 0.2–1.0)
pH, UA: 6.5 (ref 5.0–7.5)

## 2017-05-09 LAB — HIV ANTIBODY (ROUTINE TESTING W REFLEX): HIV SCREEN 4TH GENERATION: NONREACTIVE

## 2017-05-09 LAB — ABO

## 2017-05-09 LAB — GC/CHLAMYDIA PROBE AMP
CHLAMYDIA, DNA PROBE: NEGATIVE
NEISSERIA GONORRHOEAE BY PCR: NEGATIVE

## 2017-05-09 LAB — RUBELLA SCREEN: Rubella Antibodies, IGG: <0.9 index — ABNORMAL LOW (ref >0.99)

## 2017-05-09 LAB — HEPATITIS B SURFACE ANTIGEN: HEP B S AG: NEGATIVE

## 2017-05-09 LAB — RPR: RPR Ser Ql: NONREACTIVE

## 2017-05-10 ENCOUNTER — Encounter: Payer: Self-pay | Admitting: Maternal Newborn

## 2017-05-10 ENCOUNTER — Telehealth: Payer: Self-pay | Admitting: Physician Assistant

## 2017-05-10 LAB — CULTURE, OB URINE

## 2017-05-10 LAB — URINE CULTURE, OB REFLEX

## 2017-05-10 NOTE — Telephone Encounter (Signed)
Pt request for Haley Russell to return her call to discuss a referral to terminate her pregnancy. Pt's pregnancy was confirmed at OV on 04/24/17. Please advise. Thanks TNP

## 2017-05-10 NOTE — Telephone Encounter (Signed)
I spoke with this patient personally this morning about her request to terminate pregnancy. [facility] and there is a call line for patients to schedule an appointment. I have given her this number [phone] and instructed her to call this # as it will likely be faster than my referral. Instructed her to call back with any questions or issues she might have. Just FYI, there is no action necessary.

## 2017-05-11 ENCOUNTER — Other Ambulatory Visit: Payer: Federal, State, Local not specified - PPO

## 2017-05-12 LAB — MONITOR DRUG PROFILE 14(MW)
Amphetamine Scrn, Ur: NEGATIVE ng/mL
BARBITURATE SCREEN URINE: NEGATIVE ng/mL
BENZODIAZEPINE SCREEN, URINE: NEGATIVE ng/mL
BUPRENORPHINE, URINE: NEGATIVE ng/mL
COCAINE(METAB.)SCREEN, URINE: NEGATIVE ng/mL
CREATININE(CRT), U: 140.8 mg/dL (ref 20.0–300.0)
FENTANYL, URINE: NEGATIVE pg/mL
MEPERIDINE SCREEN, URINE: NEGATIVE ng/mL
METHADONE SCREEN, URINE: NEGATIVE ng/mL
OPIATE SCREEN URINE: NEGATIVE ng/mL
OXYCODONE+OXYMORPHONE UR QL SCN: NEGATIVE ng/mL
PHENCYCLIDINE QUANTITATIVE URINE: NEGATIVE ng/mL
PROPOXYPHENE SCREEN URINE: NEGATIVE ng/mL
Ph of Urine: 6.3 (ref 4.5–8.9)
SPECIFIC GRAVITY: 1.024
TRAMADOL SCREEN, URINE: NEGATIVE ng/mL

## 2017-05-12 LAB — NICOTINE SCREEN, URINE: Cotinine Ql Scrn, Ur: NEGATIVE ng/mL

## 2017-05-12 LAB — CANNABINOID (GC/MS), URINE
Cannabinoid: POSITIVE — AB
Carboxy THC (GC/MS): >300 ng/mL

## 2017-05-15 ENCOUNTER — Ambulatory Visit (INDEPENDENT_AMBULATORY_CARE_PROVIDER_SITE_OTHER): Payer: Federal, State, Local not specified - PPO

## 2017-05-15 DIAGNOSIS — Z3687 Encounter for antenatal screening for uncertain dates: Secondary | ICD-10-CM

## 2017-05-15 DIAGNOSIS — Z3401 Encounter for supervision of normal first pregnancy, first trimester: Secondary | ICD-10-CM | POA: Diagnosis not present

## 2017-05-16 ENCOUNTER — Encounter: Payer: Federal, State, Local not specified - PPO | Admitting: Obstetrics and Gynecology

## 2017-05-26 ENCOUNTER — Telehealth: Payer: Self-pay | Admitting: Obstetrics & Gynecology

## 2017-05-26 NOTE — Telephone Encounter (Signed)
[facility] referring patient for Amenorrhea, Positive urine pregnancy test. Pt was schedule for 05/10/17 and pt no showed appt. Following up to reschedule patient.

## 2017-05-30 NOTE — Telephone Encounter (Signed)
LVM for pt to call back to be schedule. Letter sent to provider.

## 2017-06-14 ENCOUNTER — Encounter: Payer: Self-pay | Admitting: Obstetrics and Gynecology

## 2017-06-14 ENCOUNTER — Ambulatory Visit (INDEPENDENT_AMBULATORY_CARE_PROVIDER_SITE_OTHER): Payer: Federal, State, Local not specified - PPO | Admitting: Obstetrics and Gynecology

## 2017-06-14 VITALS — BP 107/63 | HR 84 | Wt 148.1 lb

## 2017-06-14 DIAGNOSIS — Z3401 Encounter for supervision of normal first pregnancy, first trimester: Secondary | ICD-10-CM

## 2017-06-14 NOTE — Addendum Note (Signed)
Addended by: Brooke DareSICK, Breionna Punt L on: 06/14/2017 12:34 PM   Modules accepted: Orders

## 2017-06-14 NOTE — Progress Notes (Signed)
NOB visit: Patient with occasional nausea and vomiting.  Takes Phenergan on the days when she is sick.  Is not taking vitamins at this time because she feels they make her sick.  Physical examination General NAD, Conversant  HEENT Atraumatic; Op clear with mmm.  Normo-cephalic. Pupils reactive. Anicteric sclerae  Thyroid/Neck Smooth without nodularity or enlargement. Normal ROM.  Neck Supple.  Skin No rashes, lesions or ulceration. Normal palpated skin turgor. No nodularity.  Breasts: No masses or discharge.  Symmetric.  No axillary adenopathy.  Lungs: Clear to auscultation.No rales or wheezes. Normal Respiratory effort, no retractions.  Heart: NSR.  No murmurs or rubs appreciated. No periferal edema  Abdomen: Soft.  Non-tender.  No masses.  No HSM. No hernia  Extremities: Moves all appropriately.  Normal ROM for age. No lymphadenopathy.  Neuro: Oriented to PPT.  Normal mood. Normal affect.     Pelvic:   Vulva: Normal appearance.  No lesions.  Vagina: No lesions or abnormalities noted.  Support: Normal pelvic support.  Urethra No masses tenderness or scarring.  Meatus Normal size without lesions or prolapse.  Cervix: Normal appearance.  No lesions.  Anus: Normal exam.  No lesions.  Perineum: Normal exam.  No lesions.        Bimanual   Adnexae: No masses.  Non-tender to palpation.  Uterus: Enlarged. 13wks  Non-tender.  Mobile.  AV.   Pos  FHT's  Adnexae: No masses.  Non-tender to palpation.  Cul-de-sac: Negative for abnormality.  Adnexae: No masses.  Non-tender to palpation.         Pelvimetry   Diagonal: Reached.  Spines: Average.  Sacrum: Concave.  Pubic Arch: Normal.   A: Primigravida.  Occasional nausea.  Otherwise well.  P:  Prenatal Plan 1.  The patient was given prenatal literature. 2.  She was begun on prenatal vitamins. 3.  Genetic testing and testing for other inheritable conditions discussed in detail. She will decide in the future whether to have these labs  performed.  She would like to inform us at her 16-week visit. 7.  A general overview of pregnancy testing, visit schedule, ultrasound schedule, and prenatal care was discussed.

## 2017-06-16 LAB — PAP IG, CT-NG, RFX HPV ASCU
Chlamydia, Nuc. Acid Amp: NEGATIVE
GONOCOCCUS BY NUCLEIC ACID AMP: NEGATIVE
PAP SMEAR COMMENT: 0

## 2017-07-12 ENCOUNTER — Encounter: Payer: Self-pay | Admitting: Obstetrics and Gynecology

## 2017-07-12 ENCOUNTER — Ambulatory Visit (INDEPENDENT_AMBULATORY_CARE_PROVIDER_SITE_OTHER): Payer: Federal, State, Local not specified - PPO | Admitting: Obstetrics and Gynecology

## 2017-07-12 VITALS — BP 112/74 | HR 102 | Wt 150.9 lb

## 2017-07-12 DIAGNOSIS — Z1379 Encounter for other screening for genetic and chromosomal anomalies: Secondary | ICD-10-CM

## 2017-07-12 DIAGNOSIS — Z23 Encounter for immunization: Secondary | ICD-10-CM | POA: Diagnosis not present

## 2017-07-12 DIAGNOSIS — Z3400 Encounter for supervision of normal first pregnancy, unspecified trimester: Secondary | ICD-10-CM | POA: Insufficient documentation

## 2017-07-12 DIAGNOSIS — O9989 Other specified diseases and conditions complicating pregnancy, childbirth and the puerperium: Secondary | ICD-10-CM

## 2017-07-12 DIAGNOSIS — Z283 Underimmunization status: Secondary | ICD-10-CM

## 2017-07-12 DIAGNOSIS — O09899 Supervision of other high risk pregnancies, unspecified trimester: Secondary | ICD-10-CM

## 2017-07-12 DIAGNOSIS — F1290 Cannabis use, unspecified, uncomplicated: Secondary | ICD-10-CM | POA: Insufficient documentation

## 2017-07-12 DIAGNOSIS — O26899 Other specified pregnancy related conditions, unspecified trimester: Secondary | ICD-10-CM | POA: Insufficient documentation

## 2017-07-12 DIAGNOSIS — Z2839 Other underimmunization status: Secondary | ICD-10-CM | POA: Insufficient documentation

## 2017-07-12 DIAGNOSIS — Z6791 Unspecified blood type, Rh negative: Secondary | ICD-10-CM | POA: Insufficient documentation

## 2017-07-12 LAB — POCT URINALYSIS DIPSTICK
Bilirubin, UA: NEGATIVE
Blood, UA: NEGATIVE
CLARITY UA: NEGATIVE
GLUCOSE UA: NEGATIVE
KETONES UA: NEGATIVE
Leukocytes, UA: NEGATIVE
Nitrite, UA: NEGATIVE
SPEC GRAV UA: 1.01 (ref 1.010–1.025)
Urobilinogen, UA: 0.2 E.U./dL
pH, UA: 8.5 — AB (ref 5.0–8.0)

## 2017-07-12 NOTE — Progress Notes (Signed)
ROB-pt doing well.  

## 2017-07-12 NOTE — Progress Notes (Signed)
ROB: Doing well, no complaints. Has now decided on genetic testing, will do MaterniT21/AFP.  Discussed need for rubella and varicella postpartum. For flu vaccine today. RTC in 4 weeks, for anatomy scan at that time.

## 2017-07-14 LAB — AFP, SERUM, OPEN SPINA BIFIDA
AFP MoM: 0.63
AFP Value: 21.2 ng/mL
Gest. Age on Collection Date: 16.4 weeks
Maternal Age At EDD: 21.9 yr
OSBR RISK 1 IN: 10000
TEST RESULTS AFP: NEGATIVE
WEIGHT: 150 [lb_av]

## 2017-07-17 LAB — MATERNIT21  PLUS CORE+ESS+SCA, BLOOD
CHROMOSOME 18: NEGATIVE
Chromosome 13: NEGATIVE
Chromosome 21: NEGATIVE
Y CHROMOSOME: NOT DETECTED

## 2017-07-20 ENCOUNTER — Telehealth: Payer: Self-pay

## 2017-07-20 ENCOUNTER — Telehealth: Payer: Self-pay | Admitting: Obstetrics and Gynecology

## 2017-07-20 NOTE — Telephone Encounter (Signed)
The patient called to check on her results, The patient would like a call back when we receive them. No other information was disclosed. Please advise.

## 2017-07-20 NOTE — Telephone Encounter (Signed)
Talked to pt and informed her that he spina bifida was normal and she wanted to know the sex of her baby and I informed her it was a girl.

## 2017-07-20 NOTE — Telephone Encounter (Signed)
Please see another encounter.

## 2017-07-20 NOTE — Telephone Encounter (Signed)
-----   Message from Hildred LaserAnika Cherry, MD sent at 07/18/2017  2:00 PM EST ----- Please inform patient of her normal spina bifida and genetic screening results. Is a female fetus if they desire to know the sex of the baby.

## 2017-07-21 NOTE — Telephone Encounter (Signed)
Please see another enounter

## 2017-07-21 NOTE — Telephone Encounter (Signed)
Please see another encounter.

## 2017-08-09 ENCOUNTER — Ambulatory Visit (INDEPENDENT_AMBULATORY_CARE_PROVIDER_SITE_OTHER): Payer: Federal, State, Local not specified - PPO

## 2017-08-09 ENCOUNTER — Ambulatory Visit (INDEPENDENT_AMBULATORY_CARE_PROVIDER_SITE_OTHER): Payer: Federal, State, Local not specified - PPO | Admitting: Obstetrics and Gynecology

## 2017-08-09 VITALS — BP 99/62 | HR 66 | Wt 157.7 lb

## 2017-08-09 DIAGNOSIS — Z3400 Encounter for supervision of normal first pregnancy, unspecified trimester: Secondary | ICD-10-CM | POA: Diagnosis not present

## 2017-08-09 DIAGNOSIS — Z3492 Encounter for supervision of normal pregnancy, unspecified, second trimester: Secondary | ICD-10-CM

## 2017-08-09 LAB — POCT URINALYSIS DIPSTICK
BILIRUBIN UA: NEGATIVE
GLUCOSE UA: NEGATIVE
Ketones, UA: NEGATIVE
Leukocytes, UA: NEGATIVE
Nitrite, UA: NEGATIVE
Protein, UA: NEGATIVE
RBC UA: NEGATIVE
Spec Grav, UA: 1.01 (ref 1.010–1.025)
Urobilinogen, UA: 0.2 E.U./dL
pH, UA: 6 (ref 5.0–8.0)

## 2017-08-09 NOTE — Progress Notes (Signed)
ROB- anatomy scan done today, pt is doing well 

## 2017-08-09 NOTE — Progress Notes (Signed)
ROB: Without complaint.  She is not feeling the baby move yet-we have discussed this in detail.  Fetal anatomic survey done today-normal.  MaterniT 21 and AFP done -  normal.  Patient taking vitamins as directed.

## 2017-09-06 ENCOUNTER — Encounter: Payer: Federal, State, Local not specified - PPO | Admitting: Obstetrics and Gynecology

## 2017-09-08 ENCOUNTER — Ambulatory Visit (INDEPENDENT_AMBULATORY_CARE_PROVIDER_SITE_OTHER): Payer: Federal, State, Local not specified - PPO | Admitting: Obstetrics and Gynecology

## 2017-09-08 VITALS — BP 115/70 | HR 69 | Wt 169.2 lb

## 2017-09-08 DIAGNOSIS — R0781 Pleurodynia: Secondary | ICD-10-CM

## 2017-09-08 DIAGNOSIS — Z3402 Encounter for supervision of normal first pregnancy, second trimester: Secondary | ICD-10-CM

## 2017-09-08 DIAGNOSIS — Z130 Encounter for screening for diseases of the blood and blood-forming organs and certain disorders involving the immune mechanism: Secondary | ICD-10-CM

## 2017-09-08 DIAGNOSIS — Z113 Encounter for screening for infections with a predominantly sexual mode of transmission: Secondary | ICD-10-CM

## 2017-09-08 DIAGNOSIS — F1290 Cannabis use, unspecified, uncomplicated: Secondary | ICD-10-CM

## 2017-09-08 DIAGNOSIS — Z131 Encounter for screening for diabetes mellitus: Secondary | ICD-10-CM

## 2017-09-08 LAB — POCT URINALYSIS DIPSTICK
BILIRUBIN UA: NEGATIVE
Blood, UA: NEGATIVE
GLUCOSE UA: NEGATIVE
Ketones, UA: NEGATIVE
Leukocytes, UA: NEGATIVE
Nitrite, UA: NEGATIVE
Protein, UA: NEGATIVE
Urobilinogen, UA: 0.2 E.U./dL
pH, UA: 8.5 — AB (ref 5.0–8.0)

## 2017-09-08 NOTE — Progress Notes (Signed)
ROB: Patient c/o right-sided rib pain/burning x 1 week.  Mostly in evening. Alleviated by lying on her back. Discussed possibility of nerve irritation/costochondritis. Advised on Tylenol prn.  RTC in 4 weeks. For 28 week labs and rhogam at that time. Will also need repeat UDS for h/o MJ use.

## 2017-09-08 NOTE — Progress Notes (Signed)
ROB-pt has been having some burning in her ribs and stated that I has been happen for 1 wk. Happens mostly in the evening and goes away when she lays on her back.

## 2017-09-08 NOTE — Patient Instructions (Signed)
Costochondritis Costochondritis is swelling and irritation (inflammation) of the tissue (cartilage) that connects your ribs to your breastbone (sternum). This causes pain in the front of your chest. Usually, the pain:  Starts gradually.  Is in more than one rib.  This condition usually goes away on its own over time. Follow these instructions at home:  Do not do anything that makes your pain worse.  If directed, put ice on the painful area: ? Put ice in a plastic bag. ? Place a towel between your skin and the bag. ? Leave the ice on for 20 minutes, 2-3 times a day.  If directed, put heat on the affected area as often as told by your doctor. Use the heat source that your doctor tells you to use, such as a moist heat pack or a heating pad. ? Place a towel between your skin and the heat source. ? Leave the heat on for 20-30 minutes. ? Take off the heat if your skin turns bright red. This is very important if you cannot feel pain, heat, or cold. You may have a greater risk of getting burned.  Take over-the-counter and prescription medicines only as told by your doctor.  Return to your normal activities as told by your doctor. Ask your doctor what activities are safe for you.  Keep all follow-up visits as told by your doctor. This is important. Contact a doctor if:  You have chills or a fever.  Your pain does not go away or it gets worse.  You have a cough that does not go away. Get help right away if:  You are short of breath. This information is not intended to replace advice given to you by your health care provider. Make sure you discuss any questions you have with your health care provider. Document Released: 11/16/2007 Document Revised: 12/18/2015 Document Reviewed: 09/23/2015 Elsevier Interactive Patient Education  2018 [employer]  

## 2017-09-27 ENCOUNTER — Ambulatory Visit (INDEPENDENT_AMBULATORY_CARE_PROVIDER_SITE_OTHER): Payer: Federal, State, Local not specified - PPO | Admitting: Obstetrics and Gynecology

## 2017-09-27 ENCOUNTER — Telehealth: Payer: Self-pay

## 2017-09-27 ENCOUNTER — Other Ambulatory Visit: Payer: Federal, State, Local not specified - PPO

## 2017-09-27 VITALS — BP 97/59 | HR 76 | Wt 170.4 lb

## 2017-09-27 DIAGNOSIS — Z23 Encounter for immunization: Secondary | ICD-10-CM

## 2017-09-27 DIAGNOSIS — Z6791 Unspecified blood type, Rh negative: Secondary | ICD-10-CM

## 2017-09-27 DIAGNOSIS — Z131 Encounter for screening for diabetes mellitus: Secondary | ICD-10-CM

## 2017-09-27 DIAGNOSIS — Z3402 Encounter for supervision of normal first pregnancy, second trimester: Secondary | ICD-10-CM | POA: Diagnosis not present

## 2017-09-27 DIAGNOSIS — O26892 Other specified pregnancy related conditions, second trimester: Secondary | ICD-10-CM

## 2017-09-27 LAB — POCT URINALYSIS DIPSTICK
BILIRUBIN UA: NEGATIVE
Blood, UA: NEGATIVE
GLUCOSE UA: NEGATIVE
Ketones, UA: NEGATIVE
Leukocytes, UA: NEGATIVE
Nitrite, UA: NEGATIVE
Protein, UA: NEGATIVE
Spec Grav, UA: 1.02 (ref 1.010–1.025)
Urobilinogen, UA: 0.2 E.U./dL
pH, UA: 6.5 (ref 5.0–8.0)

## 2017-09-27 NOTE — Progress Notes (Signed)
ROB: Complains of right-sided intercostal pain.  Otherwise well.  1 hour GCT today.  RhoGam.  No heavy lifting note for work.

## 2017-09-27 NOTE — Progress Notes (Signed)
ROB-pt back pains around the ribs sore to touch.

## 2017-09-27 NOTE — Telephone Encounter (Signed)
Called pt to informed her that she had a work restriction note and did she want it to be mailed or did she want to pick it up. Pt stated that she wanted it mailed. Letter was mailed today.

## 2017-09-28 LAB — CBC
Hematocrit: 34.8 % (ref 34.0–46.6)
Hemoglobin: 11.5 g/dL (ref 11.1–15.9)
MCH: 30.5 pg (ref 26.6–33.0)
MCHC: 33 g/dL (ref 31.5–35.7)
MCV: 92 fL (ref 79–97)
PLATELETS: 250 10*3/uL (ref 150–379)
RBC: 3.77 x10E6/uL (ref 3.77–5.28)
RDW: 13.6 % (ref 12.3–15.4)
WBC: 11.8 10*3/uL — ABNORMAL HIGH (ref 3.4–10.8)

## 2017-09-28 LAB — GLUCOSE, 1 HOUR GESTATIONAL: Gestational Diabetes Screen: 105 mg/dL (ref 65–139)

## 2017-09-28 LAB — RPR: RPR Ser Ql: NONREACTIVE

## 2017-10-02 ENCOUNTER — Telehealth: Payer: Self-pay

## 2017-10-02 NOTE — Telephone Encounter (Signed)
Pt informed providers instructions.

## 2017-10-12 ENCOUNTER — Telehealth: Payer: Self-pay | Admitting: Obstetrics and Gynecology

## 2017-10-13 ENCOUNTER — Telehealth: Payer: Self-pay | Admitting: Obstetrics and Gynecology

## 2017-10-13 ENCOUNTER — Other Ambulatory Visit: Payer: Self-pay

## 2017-10-13 ENCOUNTER — Encounter: Payer: Self-pay | Admitting: *Deleted

## 2017-10-13 ENCOUNTER — Observation Stay
Admission: EM | Admit: 2017-10-13 | Discharge: 2017-10-13 | Disposition: A | Discharge status: Home / Self Care | Payer: Medicaid Other | Attending: Obstetrics and Gynecology | Admitting: Obstetrics and Gynecology

## 2017-10-13 DIAGNOSIS — O99713 Diseases of the skin and subcutaneous tissue complicating pregnancy, third trimester: Secondary | ICD-10-CM | POA: Diagnosis not present

## 2017-10-13 DIAGNOSIS — R600 Localized edema: Secondary | ICD-10-CM

## 2017-10-13 DIAGNOSIS — L559 Sunburn, unspecified: Secondary | ICD-10-CM | POA: Diagnosis not present

## 2017-10-13 DIAGNOSIS — O26893 Other specified pregnancy related conditions, third trimester: Secondary | ICD-10-CM

## 2017-10-13 DIAGNOSIS — Z3A29 29 weeks gestation of pregnancy: Secondary | ICD-10-CM | POA: Diagnosis not present

## 2017-10-13 NOTE — Telephone Encounter (Signed)
The patient called and stated that she would like to be seen due to her swollen ankles I offered the patient the available appointments and she declined. Thank you.

## 2017-10-13 NOTE — Telephone Encounter (Signed)
Pt called yesterday around 4:30pm stating that her feet and legs were swollen and she was unable to walk. Pt stated that she worked 6 hours and was doing a lot of walking and standing at [employer]. Pt was advised to elevate her feet higher than her heart for 1 hours and drink 32 oz of water and if the swelling did not go down to go to the ED. Pt was called back to see how she was doing due to her calling yesterday stating that her feet were swollen and unable to walk or stand. Pt stated that her legs had went down but it was still hard to walk or stand. Pt stated that it felt like joint pain and she also had a sunburn on her legs. Pt was advised to take tylenol for pain and aloe vera gel for the sunburn. Pt stated that she had already done all of that. Pt was suggested to come into the office to be seen by the doctor but she declined. Pt was encouraged if she felt like her symptoms were increasing to make an appointment to be seen by the doctor.

## 2017-10-13 NOTE — Final Progress Note (Signed)
L&D OB Triage Note  Haley Russell is a 22 y.o. G1P0 female at [redacted]w[redacted]d, EDD Estimated Date of Delivery: 12/23/17 who presented to triage for complaints of sunburn.  Patient reports sunbathing on Wednesday for ~ 1 hour.  Currently complaining of leg swelling x 1 day, and burn noted on face, chest, shoulders, abdomen, and legs.  Patient also reported that she read on [employer] that sun exposure was not good to the fetus and was concerned about her baby.   She was evaluated by the nurses with the above findings noted. Vital signs stable. An NST was performed and has been reviewed by MD.    NST INTERPRETATION: Indications: patient reassurance and rule out uterine contractions    Baseline Rate (A): 130 bpm Variability: Moderate Accelerations: 15 x 15 Decelerations: None     Contraction Frequency (min): none noted  Impression: reactive   Plan: NST performed was reviewed and was found to be reactive. She was discharged home and encouraged to maintain adequate hydration, apply Aloe Vera to skin, cool compresses or baths, can use Noxeema or Benadryl for symptom relief and Tylenol for pain.  Continue routine prenatal care. Follow up with OB/GYN as previously scheduled.     Hildred Laser, MD [facility]

## 2017-10-13 NOTE — OB Triage Note (Signed)
Sunburn noted on face, chest, shoulders, abdomen, and legs. Pt reports sunbathing Wednesday for approx an hour. Denies using sunscreen. No blistering noted. Edema to bilateral lower extremities. Non-pitting edema noted. Pt reports pain in bilateral lower extremities below the knees. Aloe applied Wednesday evening with no relief. Swelling started Thursday evening. Haley Russell

## 2017-10-18 ENCOUNTER — Ambulatory Visit (INDEPENDENT_AMBULATORY_CARE_PROVIDER_SITE_OTHER): Payer: Federal, State, Local not specified - PPO | Admitting: Obstetrics and Gynecology

## 2017-10-18 VITALS — BP 91/57 | HR 72 | Wt 174.2 lb

## 2017-10-18 DIAGNOSIS — O26899 Other specified pregnancy related conditions, unspecified trimester: Secondary | ICD-10-CM

## 2017-10-18 DIAGNOSIS — Z23 Encounter for immunization: Secondary | ICD-10-CM

## 2017-10-18 DIAGNOSIS — Z3403 Encounter for supervision of normal first pregnancy, third trimester: Secondary | ICD-10-CM

## 2017-10-18 DIAGNOSIS — Z6791 Unspecified blood type, Rh negative: Secondary | ICD-10-CM

## 2017-10-18 LAB — POCT URINALYSIS DIPSTICK
BILIRUBIN UA: NEGATIVE
Glucose, UA: NEGATIVE
Ketones, UA: NEGATIVE
Leukocytes, UA: NEGATIVE
Nitrite, UA: NEGATIVE
PH UA: 8 (ref 5.0–8.0)
PROTEIN UA: NEGATIVE
RBC UA: NEGATIVE
UROBILINOGEN UA: 0.2 U/dL

## 2017-10-18 MED ORDER — TETANUS-DIPHTH-ACELL PERTUSSIS 5-2.5-18.5 LF-MCG/0.5 IM SUSP
0.5000 mL | Freq: Once | INTRAMUSCULAR | Status: AC
  Administered 2017-10-18: 0.5 mL via INTRAMUSCULAR

## 2017-10-18 MED ORDER — RHO D IMMUNE GLOBULIN 1500 UNIT/2ML IJ SOSY
300.0000 ug | PREFILLED_SYRINGE | Freq: Once | INTRAMUSCULAR | Status: AC
  Administered 2017-10-18: 300 ug via INTRAMUSCULAR

## 2017-10-18 NOTE — Progress Notes (Signed)
ROB-pt is doing well no concerns. Legs were swollen really bad last week that she couldn't walk or stand. pts legs have gone down but she still has the sunburn.

## 2017-10-18 NOTE — Progress Notes (Signed)
ROB: Patient with sunburn over the weekend, noted leg swelling very bad but now has gone down. Using Aloe Vera for the sunburn. Needs Rhogam.

## 2017-11-01 ENCOUNTER — Encounter: Payer: Self-pay | Admitting: Obstetrics and Gynecology

## 2017-11-01 ENCOUNTER — Ambulatory Visit (INDEPENDENT_AMBULATORY_CARE_PROVIDER_SITE_OTHER): Payer: Federal, State, Local not specified - PPO | Admitting: Obstetrics and Gynecology

## 2017-11-01 VITALS — BP 92/55 | HR 87 | Wt 172.6 lb

## 2017-11-01 DIAGNOSIS — Z3403 Encounter for supervision of normal first pregnancy, third trimester: Secondary | ICD-10-CM | POA: Diagnosis not present

## 2017-11-01 LAB — POCT URINALYSIS DIPSTICK
BILIRUBIN UA: NEGATIVE
GLUCOSE UA: NEGATIVE
KETONES UA: NEGATIVE
Leukocytes, UA: NEGATIVE
Nitrite, UA: NEGATIVE
Odor: NEGATIVE
Protein, UA: NEGATIVE
RBC UA: NEGATIVE
SPEC GRAV UA: 1.01 (ref 1.010–1.025)
Urobilinogen, UA: 0.2 E.U./dL
pH, UA: 8 (ref 5.0–8.0)

## 2017-11-01 NOTE — Progress Notes (Signed)
ROB- no complaints.  

## 2017-11-01 NOTE — Progress Notes (Signed)
ROB: Patient doing well without complaints.  Sunburn dramatically improved.  Reports normal fetal movement.  Patient watching calories and trying to maintain weight.

## 2017-11-15 ENCOUNTER — Ambulatory Visit (INDEPENDENT_AMBULATORY_CARE_PROVIDER_SITE_OTHER): Payer: Federal, State, Local not specified - PPO | Admitting: Obstetrics and Gynecology

## 2017-11-15 VITALS — BP 95/60 | HR 84 | Wt 175.0 lb

## 2017-11-15 DIAGNOSIS — Z3483 Encounter for supervision of other normal pregnancy, third trimester: Secondary | ICD-10-CM

## 2017-11-15 DIAGNOSIS — R102 Pelvic and perineal pain: Secondary | ICD-10-CM

## 2017-11-15 DIAGNOSIS — O26899 Other specified pregnancy related conditions, unspecified trimester: Secondary | ICD-10-CM

## 2017-11-15 LAB — POCT URINALYSIS DIPSTICK
Bilirubin, UA: NEGATIVE
Blood, UA: NEGATIVE
GLUCOSE UA: NEGATIVE
Ketones, UA: NEGATIVE
Leukocytes, UA: NEGATIVE
Nitrite, UA: NEGATIVE
PROTEIN UA: POSITIVE — AB
Urobilinogen, UA: 0.2 E.U./dL
pH, UA: 8.5 — AB (ref 5.0–8.0)

## 2017-11-15 MED ORDER — PRENATAL MULTIVITAMIN PLUS DHA 27-0.8-250 MG PO CAPS: 1.0000 | ORAL_CAPSULE | Freq: Every day | ORAL | 3 refills | Status: DC

## 2017-11-15 NOTE — Progress Notes (Signed)
ROB: Notes lower pelvic pressure and soreness. Discussed belly band, warm baths, Tylenol prn.  Desires to bottle feed. Considering hormonal IUD for contraception. RTC in 2 weeks, for 36 week labs at that time.

## 2017-11-15 NOTE — Progress Notes (Signed)
ROB-pt stated that she is having some lower pelvic pain. No other concerns.

## 2017-11-27 ENCOUNTER — Other Ambulatory Visit: Payer: Self-pay

## 2017-11-27 ENCOUNTER — Telehealth: Payer: Self-pay | Admitting: Obstetrics and Gynecology

## 2017-11-27 NOTE — Telephone Encounter (Signed)
Pt returned call and stated she does not have the FAX number just yet for her [employer] paperwork, but Geraldo PitterFikisha can call back anytime today and she should have it then.  Thanks

## 2017-11-27 NOTE — Telephone Encounter (Signed)
Pt called no answer LM via voicemail that her [employer] paperwork was completed and a copy would be at the front desk for pick up.

## 2017-11-27 NOTE — Telephone Encounter (Signed)
Pt was called no answer LM to call the office for the fax number for her [employer] paperwork.

## 2017-11-28 ENCOUNTER — Telehealth: Payer: Self-pay | Admitting: Obstetrics and Gynecology

## 2017-11-28 NOTE — Telephone Encounter (Signed)
The patient called and stated that she missed a call from her nurse, and would like a call back if possible. Please advise.

## 2017-11-28 NOTE — Telephone Encounter (Signed)
Pt was called and received the fax number needed to fax her [employer] paperwork. Pt gave the number [phone] but was unsure if that was the correct number.

## 2017-11-29 ENCOUNTER — Ambulatory Visit (INDEPENDENT_AMBULATORY_CARE_PROVIDER_SITE_OTHER): Payer: Federal, State, Local not specified - PPO | Admitting: Obstetrics and Gynecology

## 2017-11-29 VITALS — BP 97/62 | HR 85 | Wt 177.3 lb

## 2017-11-29 DIAGNOSIS — Z113 Encounter for screening for infections with a predominantly sexual mode of transmission: Secondary | ICD-10-CM

## 2017-11-29 DIAGNOSIS — Z3685 Encounter for antenatal screening for Streptococcus B: Secondary | ICD-10-CM

## 2017-11-29 DIAGNOSIS — Z3403 Encounter for supervision of normal first pregnancy, third trimester: Secondary | ICD-10-CM | POA: Diagnosis not present

## 2017-11-29 LAB — POCT URINALYSIS DIPSTICK
Bilirubin, UA: NEGATIVE
Blood, UA: NEGATIVE
Glucose, UA: NEGATIVE
Ketones, UA: NEGATIVE
Leukocytes, UA: NEGATIVE
Nitrite, UA: NEGATIVE
Protein, UA: NEGATIVE
Spec Grav, UA: <=1.005 — AB
Urobilinogen, UA: 0.2 U/dL
pH, UA: 7.5

## 2017-11-29 NOTE — Progress Notes (Signed)
ROB: C/o sharp pains in lower back and pelvic region x 1 week, intermittent lasting for 10 minutes.  Has not done anything for it as it usually resolves after a short time. Discussed Tylenol, heating pads, stretching.  Patient also thinks she may have hemorrhoids.  Notes it is itchy and sore but denies bleeding. 36 week labs done today. RTC in 1 week.

## 2017-11-29 NOTE — Progress Notes (Signed)
ROB-pt stated that she has been having sharp pain in her back and pelvic pain. Pt think she may have  hemorrhoids..Marland Kitchen

## 2017-12-01 LAB — STREP GP B NAA: Strep Gp B NAA: NEGATIVE

## 2017-12-01 LAB — GC/CHLAMYDIA PROBE AMP
Chlamydia trachomatis, NAA: NEGATIVE
NEISSERIA GONORRHOEAE BY PCR: NEGATIVE

## 2017-12-04 ENCOUNTER — Encounter: Payer: Self-pay | Admitting: Obstetrics and Gynecology

## 2017-12-06 ENCOUNTER — Encounter: Payer: Self-pay | Admitting: Obstetrics and Gynecology

## 2017-12-06 ENCOUNTER — Ambulatory Visit (INDEPENDENT_AMBULATORY_CARE_PROVIDER_SITE_OTHER): Payer: Federal, State, Local not specified - PPO | Admitting: Obstetrics and Gynecology

## 2017-12-06 VITALS — BP 93/59 | HR 77 | Wt 177.5 lb

## 2017-12-06 DIAGNOSIS — Z3483 Encounter for supervision of other normal pregnancy, third trimester: Secondary | ICD-10-CM

## 2017-12-06 LAB — POCT URINALYSIS DIPSTICK
Bilirubin, UA: NEGATIVE
Blood, UA: NEGATIVE
Glucose, UA: NEGATIVE
Ketones, UA: NEGATIVE
Leukocytes, UA: NEGATIVE
Nitrite, UA: NEGATIVE
Protein, UA: POSITIVE — AB
Spec Grav, UA: 1.015 (ref 1.010–1.025)
Urobilinogen, UA: 0.2 U/dL
pH, UA: 7 (ref 5.0–8.0)

## 2017-12-06 NOTE — Progress Notes (Signed)
ROB-pt stated that her feet and hands have been swollen along with being tired and unable to stay focus on things.

## 2017-12-06 NOTE — Progress Notes (Signed)
ROB: Signs and symptoms of labor discussed.  Cervical check impossible due to patient discomfort.  Patient requesting off work next week.

## 2017-12-06 NOTE — Progress Notes (Incomplete)
ROB: Signs and symptoms of labor discussed.  Cervical check impossible due to patient discomfort.  Patient requesting off work next week. 

## 2017-12-13 ENCOUNTER — Encounter: Payer: Federal, State, Local not specified - PPO | Admitting: Obstetrics and Gynecology

## 2017-12-13 ENCOUNTER — Telehealth: Payer: Self-pay | Admitting: Obstetrics and Gynecology

## 2017-12-13 NOTE — Telephone Encounter (Signed)
The patient called and stated that she is needing to have some information changed on her FMLA paperwork and re-faxed. The patient would like to speak with her nurse and confirm everything.   The MyChart message that was sent was:   "Im still out of town right I wont be back until Sunday, I was wondering if you had changed the start date of my maternity on those papers you had once faxed and had a chance to refax them"   Please advise.

## 2017-12-17 ENCOUNTER — Observation Stay
Admission: RE | Admit: 2017-12-17 | Discharge: 2017-12-17 | Disposition: A | Discharge status: Home / Self Care | Payer: Medicaid Other | Attending: Obstetrics and Gynecology | Admitting: Obstetrics and Gynecology

## 2017-12-17 ENCOUNTER — Encounter: Payer: Self-pay | Admitting: *Deleted

## 2017-12-17 ENCOUNTER — Other Ambulatory Visit: Payer: Self-pay

## 2017-12-17 DIAGNOSIS — R110 Nausea: Secondary | ICD-10-CM | POA: Diagnosis not present

## 2017-12-17 DIAGNOSIS — M549 Dorsalgia, unspecified: Secondary | ICD-10-CM | POA: Diagnosis not present

## 2017-12-17 DIAGNOSIS — O26893 Other specified pregnancy related conditions, third trimester: Principal | ICD-10-CM | POA: Insufficient documentation

## 2017-12-17 DIAGNOSIS — O99891 Other specified diseases and conditions complicating pregnancy: Secondary | ICD-10-CM

## 2017-12-17 DIAGNOSIS — O9989 Other specified diseases and conditions complicating pregnancy, childbirth and the puerperium: Secondary | ICD-10-CM

## 2017-12-17 DIAGNOSIS — Z3A39 39 weeks gestation of pregnancy: Secondary | ICD-10-CM | POA: Diagnosis not present

## 2017-12-17 DIAGNOSIS — R51 Headache: Secondary | ICD-10-CM | POA: Insufficient documentation

## 2017-12-17 MED ORDER — ACETAMINOPHEN 325 MG PO TABS
650.0000 mg | ORAL_TABLET | Freq: Four times a day (QID) | ORAL | Status: DC | PRN
  Administered 2017-12-17: 650 mg via ORAL
  Filled 2017-12-17: qty 2

## 2017-12-17 NOTE — OB Triage Note (Signed)
Patient reports back pain is gone. She is feeling some mild tightening in her stomach with contractions. Discussed plan to discharge home. Patient agreed to plan and verbalized understanding to return to hospital for increase in painful contractions, leaking fluid, vaginal bleeding, or decreased fetal movement. EFM discontinued at this time.

## 2017-12-17 NOTE — OB Triage Note (Signed)
CO back pain x 1 hour then went away. Felt pain x 1 since being in hospital. Lower back pain intermittent. Pain scale 3 of 10. Also CO HA L side since 1730 today. Reports sharp stabbing pain. Pain scale 3 of 10. Haley HoopsElks, Haley Russell S

## 2017-12-18 NOTE — Telephone Encounter (Signed)
Spoke with pt about her dates via [facility].

## 2017-12-20 ENCOUNTER — Ambulatory Visit (INDEPENDENT_AMBULATORY_CARE_PROVIDER_SITE_OTHER): Payer: Federal, State, Local not specified - PPO | Admitting: Obstetrics and Gynecology

## 2017-12-20 VITALS — BP 90/58 | HR 82 | Wt 180.2 lb

## 2017-12-20 DIAGNOSIS — Z3483 Encounter for supervision of other normal pregnancy, third trimester: Secondary | ICD-10-CM

## 2017-12-20 DIAGNOSIS — O480 Post-term pregnancy: Secondary | ICD-10-CM

## 2017-12-20 LAB — POCT URINALYSIS DIPSTICK
Bilirubin, UA: NEGATIVE
Blood, UA: NEGATIVE
Glucose, UA: NEGATIVE
Ketones, UA: NEGATIVE
Leukocytes, UA: NEGATIVE
Nitrite, UA: NEGATIVE
PH UA: 7.5 (ref 5.0–8.0)
Protein, UA: NEGATIVE
Spec Grav, UA: <=1.005 — AB (ref 1.010–1.025)
Urobilinogen, UA: 0.2 E.U./dL

## 2017-12-20 NOTE — Progress Notes (Signed)
Pt stated that she is having some cramping in her abd area and some pelvic pain.

## 2017-12-20 NOTE — Progress Notes (Signed)
ROB: Notes complaints of cramping and pelvic pain. Seen in triage 3 days ago, ruled out for labor. Discussed labor precautions, scheduled for IOL on 7/19 (@ 0600)at 41 weeks if no labor prior to then. For BPP growth next week with OB appt.

## 2017-12-24 ENCOUNTER — Encounter: Payer: Self-pay | Admitting: *Deleted

## 2017-12-24 ENCOUNTER — Observation Stay
Admission: AD | Admit: 2017-12-24 | Discharge: 2017-12-24 | Disposition: A | Discharge status: Home / Self Care | Payer: Medicaid Other | Attending: Obstetrics and Gynecology | Admitting: Obstetrics and Gynecology

## 2017-12-24 DIAGNOSIS — O480 Post-term pregnancy: Secondary | ICD-10-CM

## 2017-12-24 DIAGNOSIS — Z3A40 40 weeks gestation of pregnancy: Secondary | ICD-10-CM | POA: Insufficient documentation

## 2017-12-24 DIAGNOSIS — Z3490 Encounter for supervision of normal pregnancy, unspecified, unspecified trimester: Secondary | ICD-10-CM

## 2017-12-24 DIAGNOSIS — O26893 Other specified pregnancy related conditions, third trimester: Principal | ICD-10-CM | POA: Insufficient documentation

## 2017-12-24 DIAGNOSIS — R109 Unspecified abdominal pain: Secondary | ICD-10-CM | POA: Insufficient documentation

## 2017-12-24 DIAGNOSIS — O471 False labor at or after 37 completed weeks of gestation: Secondary | ICD-10-CM | POA: Diagnosis not present

## 2017-12-24 MED ORDER — MEPERIDINE HCL 25 MG/ML IJ SOLN
75.0000 mg | Freq: Once | INTRAMUSCULAR | Status: AC
  Administered 2017-12-24: 75 mg via INTRAMUSCULAR
  Filled 2017-12-24: qty 3

## 2017-12-24 MED ORDER — PROMETHAZINE HCL 25 MG/ML IJ SOLN
25.0000 mg | Freq: Once | INTRAMUSCULAR | Status: AC
  Administered 2017-12-24: 25 mg via INTRAMUSCULAR
  Filled 2017-12-24: qty 1

## 2017-12-24 NOTE — Discharge Summary (Signed)
L&D OB Triage Note  Haley Russell is a 22 y.o. G1P0 female at 5188w1d, EDD Estimated Date of Delivery: 12/23/17 who presented to triage for complaints of back pain x 1 hour then went away, pain was 8/10.  She denied urinary complaints. She also c/o headache. She was evaluated by the nurses with no significant findings for labor. Vital signs stable. An NST was performed and has been reviewed by MD. She was treated with Tylenol ES.   NST INTERPRETATION: Indications: rule out uterine contractions  Mode: External(EFM d/c'd for pt discharge home) Baseline Rate (A): 125 bpm Variability: Moderate Accelerations: 15 x 15 Decelerations: None     Contraction Frequency (min): 3-6.5  Impression: reactive     Plan: NST performed was reviewed and was found to be reactive. She was discharged home with bleeding/labor precautions.  Headache and back pain improved with  Tylenol, can continue use as outpatient. Continue routine prenatal care. Follow up with OB/GYN as previously scheduled.     Hildred LaserAnika Shatara Stanek, MD [facility]

## 2017-12-24 NOTE — Progress Notes (Signed)
Spoke to Dr Logan Boresevans about patient condition, sleeping overnight, now awake having irregular contractions every 3- 8 mins that palpate mild, cervical exam unchanged from overnight. Patient rates pain 10 of 10 in lower abdomen with contractions, fetal tracing category 1. Dr Logan BoresEvans states patient not in labor she can be dischrarged with labor precautions. Information relayed to patient who became very upset she asked to be admitted for labor since her induction scheduled for 7/19, explained that as of now that would be considered elective since she is not in active labor. She refused to leave and states that if she leaves [facility] her baby. Again spoke to Dr Logan BoresEvans to relay this information at 0800 and he restated that she is not in active labor and needs to go home. Patient very upset that we will not care for her and not understanding the difference between staying now while in latent labor vs coming in for an induction on Friday. Aware that she can go into active labor at any time and labor precautions reviewed with patient again.  Pt says she has no questions and "does not want to talk to anybody else about this right now".She agreed to be discharged and again states [facility] .

## 2017-12-25 ENCOUNTER — Observation Stay
Admission: EM | Admit: 2017-12-25 | Discharge: 2017-12-25 | Disposition: A | Discharge status: Home / Self Care | Payer: Medicaid Other | Attending: Obstetrics and Gynecology | Admitting: Obstetrics and Gynecology

## 2017-12-25 ENCOUNTER — Other Ambulatory Visit: Payer: Self-pay | Admitting: Obstetrics and Gynecology

## 2017-12-25 ENCOUNTER — Telehealth: Payer: Self-pay | Admitting: Obstetrics and Gynecology

## 2017-12-25 ENCOUNTER — Other Ambulatory Visit: Payer: Self-pay

## 2017-12-25 DIAGNOSIS — O480 Post-term pregnancy: Secondary | ICD-10-CM

## 2017-12-25 DIAGNOSIS — O26893 Other specified pregnancy related conditions, third trimester: Secondary | ICD-10-CM | POA: Diagnosis not present

## 2017-12-25 DIAGNOSIS — O471 False labor at or after 37 completed weeks of gestation: Secondary | ICD-10-CM | POA: Diagnosis not present

## 2017-12-25 DIAGNOSIS — Z3A40 40 weeks gestation of pregnancy: Secondary | ICD-10-CM | POA: Insufficient documentation

## 2017-12-25 DIAGNOSIS — Z113 Encounter for screening for infections with a predominantly sexual mode of transmission: Secondary | ICD-10-CM

## 2017-12-25 DIAGNOSIS — Z87891 Personal history of nicotine dependence: Secondary | ICD-10-CM | POA: Diagnosis not present

## 2017-12-25 NOTE — OB Triage Note (Signed)
Patient presents to L&D with complaints of contractions that have been on going since this weekend.  States she lost her mucus plug this morning.  Denies decreased fetal movement, leaking of fluid, or vaginal bleeding.

## 2017-12-25 NOTE — Final Progress Note (Signed)
L&D OB Triage Note  Haley Russell is a 22 y.o. G1P0 female at 2198w2d, EDD Estimated Date of Delivery: 12/23/17 who presented to triage for complaints of contractions.  She was evaluated by the nurses with no significant findings for active labor. Vital signs stable. An NST was performed and has been reviewed by MD.    Physical Exam (per nurse):  BP 120/71  Pulse 93   Cervical Exam1/60-70/-3.    NST INTERPRETATION: Indications: rule out uterine contractions  Mode: External Baseline Rate (A): 125 bpm Variability: Moderate Accelerations: 15 x 15 Decelerations: None     Contraction Frequency (min): 2-6  Impression: reactive   Plan: NST performed was reviewed and was found to be reactive. Patient declined waiting for 2 hr cervical check as she was unchanged from the past several triage visits she's had over the weekend. Desired something for pain from the contractions as Tylenol was not working. Advised on Benadryl for sleeping, prescribed Tramadol for 2-3 days. She was discharged home with bleeding/labor precautions.  Continue routine prenatal care. Follow up with OB/GYN as previously scheduled. Scheduled for IOL on Friday for post-dates pregnancy.    Haley LaserAnika Takenya Travaglini, MD [facility]

## 2017-12-25 NOTE — Discharge Summary (Signed)
    L&D OB Triage Note  SUBJECTIVE Clide Dalesdriana Colt is a 22 y.o. G1P0 female at 7066w2d, EDD Estimated Date of Delivery: 12/23/17 who presented to triage with complaints of occasional abdominal pains - she thinks she is in labor.     OB History  Gravida Para Term Preterm AB Living  1 0 0 0 0 0  SAB TAB Ectopic Multiple Live Births  0 0 0 0 0    # Outcome Date GA Lbr Len/2nd Weight Sex Delivery Anes PTL Lv  1 Current             No medications prior to admission.     OBJECTIVE  Nursing Evaluation:   BP (!) 112/58   Pulse 74   Temp 97.6 F (36.4 C) (Oral)   LMP 02/13/2017    Findings:   Unfavorable cervix.  Pt having very irregular (palpate mild) copntractions.     Kept overnight and given demerol and phenergan.  Slept through night     without problem.  Next morning had irregular contractions again without     cervical change.  NST was performed and has been reviewed by me.  NST INTERPRETATION: Category I  Mode: External Baseline Rate (A): 125 bpm Variability: Moderate Accelerations: 15 x 15 Decelerations: Variable(x1)     Contraction Frequency (min): 1.5-6  ASSESSMENT Impression:  1.  Pregnancy:  G1P0 at 6366w2d , EDD Estimated Date of Delivery: 12/23/17 2.  NST:  Category I  PLAN 1. Reassurance given 2. Discharge home with standard labor precautions given to return to L&D or call the office for problems. 3. Continue routine prenatal care. 4. Latent labor reviewed by nurse.  Induction not indicated as cervix unfavorable, L&D staffing not adequate for elective induction (all labor rooms occupied), no cervical change with mild contractions over @ 14 hours and fetal surveillance reassuring.

## 2017-12-25 NOTE — Telephone Encounter (Signed)
Spoke with pt and pt is at the hospital stating that her contractions had increased and she had lost her mucus plug. Pt was informed that DJE was on call and she should see him at the hospital.

## 2017-12-25 NOTE — Telephone Encounter (Signed)
Patient had contractions over the weekend, and is in a lot pain, not sleeping and lost mucus plug this morning.  No bleeding, water did not break.  Please call her back at [phone]; no voicemail but will be by the phone.  Needs direction on what to do.  Please advise, thanks.

## 2017-12-25 NOTE — Progress Notes (Signed)
Spoke with Dr.Cherry, notified of UC patterns, FHR pattern and cervical exam.  Per Dr.Cherry patient may walk in hallways or sit on birthing ball. Plan is to recheck in 2 hours to assess for cervical change.  Patient states she wants the birthing ball

## 2017-12-26 DIAGNOSIS — O480 Post-term pregnancy: Secondary | ICD-10-CM | POA: Diagnosis not present

## 2017-12-26 DIAGNOSIS — Z3A40 40 weeks gestation of pregnancy: Secondary | ICD-10-CM | POA: Diagnosis not present

## 2017-12-27 ENCOUNTER — Encounter: Payer: Federal, State, Local not specified - PPO | Admitting: Obstetrics and Gynecology

## 2017-12-27 ENCOUNTER — Encounter: Payer: Self-pay | Admitting: Obstetrics and Gynecology

## 2017-12-28 MED ORDER — BISACODYL 5 MG PO TBEC
5.00 | DELAYED_RELEASE_TABLET | ORAL | Status: DC
Start: ? — End: 2017-12-28

## 2017-12-28 MED ORDER — GENERIC EXTERNAL MEDICATION
Status: DC
Start: 2017-12-28 — End: 2017-12-28

## 2017-12-28 MED ORDER — SIMETHICONE 80 MG PO CHEW
80.00 | CHEWABLE_TABLET | ORAL | Status: DC
Start: ? — End: 2017-12-28

## 2017-12-28 MED ORDER — ALUM & MAG HYDROXIDE-SIMETH 400-400-40 MG/5ML PO SUSP
15.00 | ORAL | Status: DC
Start: ? — End: 2017-12-28

## 2017-12-28 MED ORDER — ACETAMINOPHEN 325 MG PO TABS
650.00 | ORAL_TABLET | ORAL | Status: DC
Start: 2017-12-28 — End: 2017-12-28

## 2017-12-28 MED ORDER — DIBUCAINE 1 % EX OINT
1.00 | TOPICAL_OINTMENT | CUTANEOUS | Status: DC
Start: ? — End: 2017-12-28

## 2017-12-28 MED ORDER — DIPHENHYDRAMINE HCL 25 MG PO CAPS
25.00 | ORAL_CAPSULE | ORAL | Status: DC
Start: ? — End: 2017-12-28

## 2017-12-28 MED ORDER — DOCUSATE SODIUM 100 MG PO CAPS
100.00 | ORAL_CAPSULE | ORAL | Status: DC
Start: ? — End: 2017-12-28

## 2017-12-28 MED ORDER — MAGNESIUM HYDROXIDE 400 MG/5ML PO SUSP
30.00 | ORAL | Status: DC
Start: ? — End: 2017-12-28

## 2017-12-28 MED ORDER — IBUPROFEN 600 MG PO TABS
600.00 | ORAL_TABLET | ORAL | Status: DC
Start: 2017-12-28 — End: 2017-12-28

## 2017-12-28 MED ORDER — ONDANSETRON 4 MG PO TBDP
4.00 | ORAL_TABLET | ORAL | Status: DC
Start: ? — End: 2017-12-28

## 2018-03-17 ENCOUNTER — Encounter (HOSPITAL_COMMUNITY): Payer: Self-pay

## 2019-06-25 DIAGNOSIS — G8929 Other chronic pain: Secondary | ICD-10-CM | POA: Diagnosis not present

## 2019-06-25 DIAGNOSIS — M545 Low back pain: Secondary | ICD-10-CM | POA: Diagnosis not present

## 2019-08-08 DIAGNOSIS — N3000 Acute cystitis without hematuria: Secondary | ICD-10-CM | POA: Diagnosis not present

## 2019-08-27 DIAGNOSIS — O3680X0 Pregnancy with inconclusive fetal viability, not applicable or unspecified: Secondary | ICD-10-CM | POA: Diagnosis not present

## 2019-09-04 DIAGNOSIS — N9089 Other specified noninflammatory disorders of vulva and perineum: Secondary | ICD-10-CM | POA: Diagnosis not present

## 2019-09-04 DIAGNOSIS — R87612 Low grade squamous intraepithelial lesion on cytologic smear of cervix (LGSIL): Secondary | ICD-10-CM | POA: Diagnosis not present

## 2019-09-04 DIAGNOSIS — Z3481 Encounter for supervision of other normal pregnancy, first trimester: Secondary | ICD-10-CM | POA: Diagnosis not present

## 2019-09-04 DIAGNOSIS — O3663X0 Maternal care for excessive fetal growth, third trimester, not applicable or unspecified: Secondary | ICD-10-CM | POA: Diagnosis not present

## 2019-09-04 DIAGNOSIS — O26893 Other specified pregnancy related conditions, third trimester: Secondary | ICD-10-CM | POA: Diagnosis not present

## 2019-09-04 DIAGNOSIS — Z363 Encounter for antenatal screening for malformations: Secondary | ICD-10-CM | POA: Diagnosis not present

## 2019-10-02 DIAGNOSIS — Z3481 Encounter for supervision of other normal pregnancy, first trimester: Secondary | ICD-10-CM | POA: Diagnosis not present

## 2019-11-28 DIAGNOSIS — Z363 Encounter for antenatal screening for malformations: Secondary | ICD-10-CM | POA: Diagnosis not present

## 2019-12-26 DIAGNOSIS — Z362 Encounter for other antenatal screening follow-up: Secondary | ICD-10-CM | POA: Diagnosis not present

## 2020-01-29 DIAGNOSIS — Z3493 Encounter for supervision of normal pregnancy, unspecified, third trimester: Secondary | ICD-10-CM | POA: Diagnosis not present

## 2020-01-29 DIAGNOSIS — Z23 Encounter for immunization: Secondary | ICD-10-CM | POA: Diagnosis not present

## 2020-01-29 DIAGNOSIS — Z3492 Encounter for supervision of normal pregnancy, unspecified, second trimester: Secondary | ICD-10-CM | POA: Diagnosis not present

## 2020-03-11 DIAGNOSIS — N9089 Other specified noninflammatory disorders of vulva and perineum: Secondary | ICD-10-CM | POA: Diagnosis not present

## 2020-03-11 DIAGNOSIS — R102 Pelvic and perineal pain: Secondary | ICD-10-CM | POA: Diagnosis not present

## 2020-03-11 DIAGNOSIS — O3663X0 Maternal care for excessive fetal growth, third trimester, not applicable or unspecified: Secondary | ICD-10-CM | POA: Diagnosis not present

## 2020-03-11 DIAGNOSIS — O26899 Other specified pregnancy related conditions, unspecified trimester: Secondary | ICD-10-CM | POA: Diagnosis not present

## 2020-03-27 ENCOUNTER — Observation Stay
Admission: EM | Admit: 2020-03-27 | Discharge: 2020-03-27 | Disposition: A | Discharge status: Home / Self Care | Payer: Federal, State, Local not specified - PPO | Attending: Obstetrics | Admitting: Obstetrics

## 2020-03-27 ENCOUNTER — Telehealth: Payer: Self-pay

## 2020-03-27 ENCOUNTER — Other Ambulatory Visit: Payer: Self-pay

## 2020-03-27 ENCOUNTER — Encounter: Payer: Self-pay | Admitting: Obstetrics and Gynecology

## 2020-03-27 DIAGNOSIS — Z3A37 37 weeks gestation of pregnancy: Secondary | ICD-10-CM | POA: Diagnosis not present

## 2020-03-27 DIAGNOSIS — F17210 Nicotine dependence, cigarettes, uncomplicated: Secondary | ICD-10-CM | POA: Diagnosis not present

## 2020-03-27 DIAGNOSIS — O99333 Smoking (tobacco) complicating pregnancy, third trimester: Secondary | ICD-10-CM | POA: Diagnosis not present

## 2020-03-27 DIAGNOSIS — R1030 Lower abdominal pain, unspecified: Secondary | ICD-10-CM | POA: Diagnosis not present

## 2020-03-27 DIAGNOSIS — O26893 Other specified pregnancy related conditions, third trimester: Principal | ICD-10-CM | POA: Insufficient documentation

## 2020-03-27 HISTORY — DX: Other specified health status: Z78.9

## 2020-03-27 LAB — GROUP B STREP BY PCR: Group B strep by PCR: NEGATIVE

## 2020-03-27 NOTE — Discharge Summary (Signed)
Please see Final Progress note  Mirna Mires, CNM  03/27/2020 1:38 PM

## 2020-03-27 NOTE — Final Progress Note (Signed)
Final Progress Note  Patient ID: Haley Russell MRN: [id number] DOB/AGE: 03-Nov-1995 24 y.o.  Admit date: 03/27/2020 Admitting provider: Mirna Mires, CNM Discharge date: 03/27/2020   Admission Diagnoses: [redacted] weeks gestation of pregnancy Lower abdominal apin  Discharge Diagnoses:  Active Problems:   [redacted] weeks gestation of pregnancy   Lower abdominal pain    History of Present Illness: The patient is a 24 y.o. female G2P1001 at [redacted]w[redacted]d who presents for evaluation of lower abdominal pain during her 37th week of pregnancy. She shares that she has recently moved from [place] in [facility] to [facility], and that she intends to continue her prenatal care at [facility]. As she is far advanced in this pregnancy, she was told to be evalauted once at [facility] so that she can have permision to transfer her care. She intends to deliver at [facility] in [place], but has a hx of prenatal care at [facility], so she wants some visits there .  Her baby is moving well today, and she denies any regualr contractions; denies any LOF or vaginal bleeding.she is completing treatment for BV and vaginal yeast.   Past Medical History:  Diagnosis Date  . Amenorrhea   . Medical history non-contributory     Past Surgical History:  Procedure Laterality Date  . ANAL FISSURE REPAIR      No current facility-administered medications on file prior to encounter.   Current Outpatient Medications on File Prior to Encounter  Medication Sig Dispense Refill  . nitrofurantoin, macrocrystal-monohydrate, (MACROBID) 100 MG capsule Take 100 mg by mouth 2 (two) times daily.    . Prenatal Vit-Fe Fumarate-FA (PRENATAL MULTIVITAMIN) TABS tablet Take 1 tablet by mouth daily at 12 noon.    . busPIRone (BUSPAR) 10 MG tablet Take 10 mg by mouth 2 (two) times daily. Pt states she was prescribed this for UTI. (Patient not taking: Reported on 03/27/2020)      No Known Allergies  Social History    Socioeconomic History  . Marital status: Married    Spouse name: Cyndy Freeze  . Number of children: Not on file  . Years of education: Not on file  . Highest education level: Not on file  Occupational History  . Not on file  Tobacco Use  . Smoking status: Current Every Day Smoker    Packs/day: 1.00    Years: 5.00    Pack years: 5.00    Types: Cigarettes    Last attempt to quit: 05/01/2017    Years since quitting: 2.9  . Smokeless tobacco: Never Used  Vaping Use  . Vaping Use: Former  Substance and Sexual Activity  . Alcohol use: No    Alcohol/week: 0.0 standard drinks  . Drug use: No  . Sexual activity: Yes    Comment: undecided  Other Topics Concern  . Not on file  Social History Narrative  . Not on file   Social Determinants of Health   Financial Resource Strain:   . Difficulty of Paying Living Expenses: Not on file  Food Insecurity:   . Worried About Programme researcher, broadcasting/film/video in the Last Year: Not on file  . Ran Out of Food in the Last Year: Not on file  Transportation Needs:   . Lack of Transportation (Medical): Not on file  . Lack of Transportation (Non-Medical): Not on file  Physical Activity:   . Days of Exercise per Week: Not on file  . Minutes of Exercise per Session: Not on file  Stress:   .  Feeling of Stress : Not on file  Social Connections:   . Frequency of Communication with Friends and Family: Not on file  . Frequency of Social Gatherings with Friends and Family: Not on file  . Attends Religious Services: Not on file  . Active Member of Clubs or Organizations: Not on file  . Attends Banker Meetings: Not on file  . Marital Status: Not on file  Intimate Partner Violence:   . Fear of Current or Ex-Partner: Not on file  . Emotionally Abused: Not on file  . Physically Abused: Not on file  . Sexually Abused: Not on file    Family History  Problem Relation Age of Onset  . Alcohol abuse Mother   . Cancer Father        lung     ROS    Physical Exam: BP (!) 92/56 (BP Location: Right Arm)   Pulse 83   Temp 97.9 F (36.6 C) (Oral)   Resp 16   Ht 5\' 4"  (1.626 m)   Wt 83.9 kg   LMP 07/08/2019   BMI 31.76 kg/m   OBGyn Exam  Consults: None  Significant Findings/ Diagnostic Studies: GBS culture sent (pending)  Procedures: NST NST Baseline FHR: 140 beats/min Variability: moderate Accelerations: present Decelerations: absent Tocometry: occasional 07/10/2019 UC  Interpretation:  INDICATIONS: rule out uterine contractions RESULTS:  A NST procedure was performed with FHR monitoring and a normal baseline established, appropriate time of 20-40 minutes of evaluation, and accels >2 seen w 15x15 characteristics.  Results show a REACTIVE NST.    Hospital Course: The patient was admitted to Labor and Delivery Triage for observation. She was monitored for contractions, and her c/o pelvic pain was evaluated. Her cervical exam revealed a closed cervix. A GBS culture was sent to the lab.she is discharged home in stable condition. She was encouraged to Deberah Pelton a maternity belt to reduce her complaint of pelvic pressure.  Discharge Condition: good  Disposition:  Home Diet: Regular diet  Discharge Activity: Activity as tolerated   Allergies as of 03/27/2020   No Known Allergies           Total time spent taking care of this patient: 30 minutes  Signed: 03/29/2020, CNM  03/27/2020, 1:39 PM

## 2020-03-27 NOTE — Telephone Encounter (Signed)
[place] nurse [place] The nurse told me to add the pt back on for the 10/20 but to tell her to go to the local hospital today to be checked. I informed the pt that the nurse wants to add her back on the 20th and to go to the local hospital today to get checked. The pt verbally understood

## 2020-03-27 NOTE — OB Triage Note (Addendum)
Pt is a 24yo G2P1 at [redacted]w[redacted]d that presents from ED with complaint of severe pelvic pressure that has been ongoing. Pt received prenatal care at [facility] this pregnancy and has been seen there for this same problem with no relief. Pt states she has felt positive fetal movement, denies VB and has no LOF. Pt states she is currently using vaginal inserts for BV and is currently on day 3/5 treatment. Pt denies any issues this pregnancy other than being told "My baby is measuring 3 weeks ahead." Pt states her due date is 04/13/20. Pt states pelvic pain is 9/10 scale and is worse at night and better during the day with activity. Pt had a positive MJ test at [facility] and states "I have not been retested this pregnancy but want them to remove that from my problem list."

## 2020-03-27 NOTE — Telephone Encounter (Signed)
Pt called in and stated that she is 37 weeks and that she just moved and wants to transfer her care. The pt said that she has been seen here. The pt and I made her an appt for 10/20. The pt did say that she has some pressure at night if she gets up to walk. The pt stated that she did have some diarrhea yesterday but none today. The pt did say that her other doctor said it was okay to take tylenol for the pain but its not helping I told the pt I will call back to the nurse since she is having pressure. The pt said that she hasn't been seen to see if she has  Dilated. I called back to the nurse and told her what the pt said that the nurse said that she will ask the provider. The pt said that the provider said that she needs to call [facility] or local hospital. Since she is so far in her pregnancy.The pt verbally understood.

## 2020-03-27 NOTE — OB Triage Note (Signed)
Discharge orders received. Pt instructed to follow up with her OB provider. Pt discharged home with instructions for maternity belt and with labor precautions. Pt verbalized understanding of instructions and has no further questions at this time. Pt discharged home in stable condition.

## 2020-04-01 ENCOUNTER — Encounter: Payer: Federal, State, Local not specified - PPO | Admitting: Obstetrics and Gynecology

## 2020-04-09 ENCOUNTER — Encounter: Payer: Federal, State, Local not specified - PPO | Admitting: Obstetrics and Gynecology

## 2020-04-09 DIAGNOSIS — Z6741 Type O blood, Rh negative: Secondary | ICD-10-CM | POA: Diagnosis not present

## 2020-04-09 DIAGNOSIS — O26893 Other specified pregnancy related conditions, third trimester: Secondary | ICD-10-CM | POA: Diagnosis not present

## 2020-04-09 DIAGNOSIS — O479 False labor, unspecified: Secondary | ICD-10-CM | POA: Diagnosis not present

## 2020-04-09 DIAGNOSIS — Z20822 Contact with and (suspected) exposure to covid-19: Secondary | ICD-10-CM | POA: Diagnosis not present

## 2020-04-09 DIAGNOSIS — Z3A39 39 weeks gestation of pregnancy: Secondary | ICD-10-CM | POA: Diagnosis not present

## 2020-04-10 DIAGNOSIS — Z3A39 39 weeks gestation of pregnancy: Secondary | ICD-10-CM | POA: Diagnosis not present

## 2020-04-12 ENCOUNTER — Encounter: Payer: Self-pay | Admitting: Family Medicine

## 2020-04-12 DIAGNOSIS — Z6741 Type O blood, Rh negative: Secondary | ICD-10-CM | POA: Insufficient documentation
